# Patient Record
Sex: Male | Born: 1975
Health system: Southern US, Community
[De-identification: ages and names within clinical notes are randomized; demographics above are authoritative.]

## PROBLEM LIST (undated history)

## (undated) DIAGNOSIS — R569 Unspecified convulsions: Secondary | ICD-10-CM

## (undated) DIAGNOSIS — I10 Essential (primary) hypertension: Secondary | ICD-10-CM

## (undated) DIAGNOSIS — K219 Gastro-esophageal reflux disease without esophagitis: Secondary | ICD-10-CM

## (undated) HISTORY — DX: Gastro-esophageal reflux disease without esophagitis: K21.9

## (undated) HISTORY — PX: OTHER SURGICAL HISTORY: SHX169

## (undated) HISTORY — DX: Essential (primary) hypertension: I10

## (undated) HISTORY — DX: Unspecified convulsions: R56.9

---

## 2015-03-09 ENCOUNTER — Encounter: Payer: Self-pay | Admitting: Internal Medicine

## 2015-03-09 ENCOUNTER — Other Ambulatory Visit (INDEPENDENT_AMBULATORY_CARE_PROVIDER_SITE_OTHER): Payer: PRIVATE HEALTH INSURANCE

## 2015-03-09 ENCOUNTER — Ambulatory Visit (INDEPENDENT_AMBULATORY_CARE_PROVIDER_SITE_OTHER): Payer: PRIVATE HEALTH INSURANCE | Admitting: Internal Medicine

## 2015-03-09 VITALS — BP 118/72 | HR 68 | Temp 98.5°F | Resp 20 | Ht 75.0 in | Wt 295.2 lb

## 2015-03-09 DIAGNOSIS — I10 Essential (primary) hypertension: Secondary | ICD-10-CM

## 2015-03-09 DIAGNOSIS — R569 Unspecified convulsions: Secondary | ICD-10-CM | POA: Diagnosis not present

## 2015-03-09 DIAGNOSIS — K219 Gastro-esophageal reflux disease without esophagitis: Secondary | ICD-10-CM

## 2015-03-09 DIAGNOSIS — E669 Obesity, unspecified: Secondary | ICD-10-CM

## 2015-03-09 LAB — COMPREHENSIVE METABOLIC PANEL
ALK PHOS: 131 U/L — AB (ref 39–117)
ALT: 28 U/L (ref 0–53)
AST: 19 U/L (ref 0–37)
Albumin: 4.1 g/dL (ref 3.5–5.2)
BILIRUBIN TOTAL: 0.4 mg/dL (ref 0.2–1.2)
BUN: 18 mg/dL (ref 6–23)
CO2: 31 meq/L (ref 19–32)
Calcium: 9.5 mg/dL (ref 8.4–10.5)
Chloride: 101 mEq/L (ref 96–112)
Creatinine, Ser: 1.16 mg/dL (ref 0.40–1.50)
GFR: 90.33 mL/min (ref >60.00)
GLUCOSE: 95 mg/dL (ref 70–99)
Potassium: 4.5 mEq/L (ref 3.5–5.1)
SODIUM: 138 meq/L (ref 135–145)
TOTAL PROTEIN: 8.7 g/dL — AB (ref 6.0–8.3)

## 2015-03-09 LAB — CBC WITH DIFFERENTIAL/PLATELET
BASOS ABS: 0 10*3/uL (ref 0.0–0.1)
Basophils Relative: 0.3 % (ref 0.0–3.0)
EOS ABS: 0.4 10*3/uL (ref 0.0–0.7)
Eosinophils Relative: 5 % (ref 0.0–5.0)
HCT: 39.3 % (ref 39.0–52.0)
Hemoglobin: 13.4 g/dL (ref 13.0–17.0)
LYMPHS ABS: 2.3 10*3/uL (ref 0.7–4.0)
Lymphocytes Relative: 28 % (ref 12.0–46.0)
MCHC: 34.1 g/dL (ref 30.0–36.0)
MCV: 92.7 fl (ref 78.0–100.0)
MONO ABS: 0.6 10*3/uL (ref 0.1–1.0)
Monocytes Relative: 7.1 % (ref 3.0–12.0)
NEUTROS PCT: 59.6 % (ref 43.0–77.0)
Neutro Abs: 5 10*3/uL (ref 1.4–7.7)
PLATELETS: 431 10*3/uL — AB (ref 150.0–400.0)
RBC: 4.24 Mil/uL (ref 4.22–5.81)
RDW: 13.9 % (ref 11.5–15.5)
WBC: 8.3 10*3/uL (ref 4.0–10.5)

## 2015-03-09 LAB — LIPID PANEL
CHOLESTEROL: 164 mg/dL (ref 0–200)
HDL: 53.4 mg/dL (ref >39.00)
LDL CALC: 100 mg/dL — AB (ref 0–99)
NonHDL: 110.77
TRIGLYCERIDES: 53 mg/dL (ref 0.0–149.0)
Total CHOL/HDL Ratio: 3
VLDL: 10.6 mg/dL (ref 0.0–40.0)

## 2015-03-09 LAB — TSH: TSH: 1.46 u[IU]/mL (ref 0.35–4.50)

## 2015-03-09 LAB — HEMOGLOBIN A1C: Hgb A1c MFr Bld: 5.9 % (ref 4.6–6.5)

## 2015-03-09 NOTE — Addendum Note (Signed)
Addended by: Binnie Rail on: 03/09/2015 11:07 AM   Modules accepted: Miquel Dunn

## 2015-03-09 NOTE — Progress Notes (Addendum)
Subjective:    Patient ID: James Pitts, male    DOB: 06/09/1975, 39 y.o.   MRN: [id number]  HPI  He is here to establish with a new pcp.  He has no concerns - just needs a pcp.  Seizure disorder: He was diagnosed with seizures in college-his sophomore year. He is taking Keppra twice daily. He has not had any seizure since 2011. He knows 1 trigger is sleep deprivation and he is good about that situation. Even knows that some days he may need to come late if he is sleep deprived. He was following with a neurologist regularly, but was discharged because he has been so stable.  Hypertension: He is taking his medication daily. He is somewhat compliant with a low sodium diet.  He denies chest pain, palpitations, edema, shortness of breath and regular headaches. He is not exercising regularly.  He does not monitor his blood pressure at home.    GERD:  He is taking his medication daily as prescribed.  He denies any GERD symptoms and feels his GERD is well controlled.  He is compliant with a GERD diet.    He is currently not exercising regularly, but he knows he needs to start exercising. He would like to lose weight in the next few months before his birthday.  Medications and allergies reviewed with patient and updated if appropriate.  Patient Active Problem List   Diagnosis Date Noted  . Essential hypertension, benign 03/09/2015  . GERD (gastroesophageal reflux disease) 03/09/2015  . Seizures ([place]) 03/09/2015  . Obese 03/09/2015    No current outpatient prescriptions on file prior to visit.   No current facility-administered medications on file prior to visit.    Past Medical History  Diagnosis Date  . Essential hypertension   . Seizures ([place])   . GERD (gastroesophageal reflux disease)     Past Surgical History  Procedure Laterality Date  . Tonsilecomy      Social History   Social History  . Marital Status: Married    Spouse Name: N/A  . Number of Children: 4  . Years  of Education: N/A   Social History Main Topics  . Smoking status: Never Smoker   . Smokeless tobacco: None  . Alcohol Use: No  . Drug Use: No  . Sexual Activity: Not Asked   Other Topics Concern  . None   Social History Narrative   Occupational therapist    Review of Systems  Constitutional: Negative for fever and chills.  Eyes: Negative for visual disturbance.  Respiratory: Negative for cough, shortness of breath and wheezing.   Cardiovascular: Negative for chest pain, palpitations and leg swelling.  Gastrointestinal: Negative for nausea, abdominal pain and blood in stool.       GERD  Endocrine: Negative for polydipsia and polyuria.  Musculoskeletal: Negative for back pain and arthralgias.  Neurological: Positive for headaches (occ). Negative for dizziness, weakness, light-headedness and numbness.  Psychiatric/Behavioral: Negative for dysphoric mood. The patient is not nervous/anxious.        Objective:   Filed Vitals:   03/09/15 1018  BP: 118/72  Pulse: 68  Temp: 98.5 F (36.9 C)  Resp: 20   Filed Weights   03/09/15 1018  Weight: 295 lb 4 oz (133.925 kg)   Body mass index is 36.9 kg/(m^2).   Physical Exam  Constitutional: He appears well-developed and well-nourished. No distress.  HENT:  Head: Normocephalic and atraumatic.  Right Ear: External ear normal.  Left Ear: External ear  normal.  Mouth/Throat: Oropharynx is clear and moist.  B/l ear canals and TM normal  Eyes: Conjunctivae are normal.  Neck: Neck supple. No tracheal deviation present. No thyromegaly present.  Cardiovascular: Normal rate, regular rhythm and normal heart sounds.   No murmur heard. Pulmonary/Chest: Effort normal and breath sounds normal. No respiratory distress. He has no wheezes. He has no rales.  Abdominal: Soft. He exhibits no distension. There is no tenderness.  Musculoskeletal: He exhibits no edema.  Lymphadenopathy:    He has no cervical adenopathy.  Skin: Skin is warm and  dry.  Psychiatric: He has a normal mood and affect. His behavior is normal.          Assessment & Plan:   See problem list for assessment and plan  Screening blood work ordered  Follow-up in 6 months, sooner if needed

## 2015-03-09 NOTE — Patient Instructions (Addendum)
  We have reviewed your prior records including labs and tests today.  Test(s) ordered today. Your results will be released to MyChart (or called to you) after review, usually within 72hours after test completion. If any changes need to be made, you will be notified at that same time.  No immunizations administered today.   Medications reviewed and updated.  No changes recommended at this time.  Please schedule followup in 6 months      

## 2015-03-09 NOTE — Assessment & Plan Note (Signed)
Discussed regular exercise Will check screening blood work

## 2015-03-09 NOTE — Assessment & Plan Note (Signed)
Controlled, no seizure since 2000 Continue Keppra 500 mg twice daily  Not currently seeing a neurologist-was discharged because of not having a seizure in years

## 2015-03-09 NOTE — Assessment & Plan Note (Signed)
Well-controlled Continue current medication Recheck CMP, lipid panel

## 2015-03-09 NOTE — Assessment & Plan Note (Signed)
Well-controlled with medication-typically does not forget a dose Fairly compliant with her diet Plans on working on weight loss-stressed weight loss Discussed the ultimate goal is to get off of the medication

## 2015-03-09 NOTE — Progress Notes (Signed)
Pre visit review using our clinic review tool, if applicable. No additional management support is needed unless otherwise documented below in the visit note. 

## 2015-03-12 ENCOUNTER — Encounter: Payer: Self-pay | Admitting: Internal Medicine

## 2015-03-12 DIAGNOSIS — R7303 Prediabetes: Secondary | ICD-10-CM | POA: Insufficient documentation

## 2015-03-15 ENCOUNTER — Telehealth: Payer: Self-pay | Admitting: Internal Medicine

## 2015-03-15 NOTE — Telephone Encounter (Signed)
Spoke with pt to inform of lab results. Pt understood.

## 2015-03-15 NOTE — Telephone Encounter (Signed)
Pt returned your call. Please call back.

## 2015-06-14 ENCOUNTER — Encounter: Payer: Self-pay | Admitting: Nurse Practitioner

## 2015-06-14 ENCOUNTER — Ambulatory Visit (INDEPENDENT_AMBULATORY_CARE_PROVIDER_SITE_OTHER): Payer: PRIVATE HEALTH INSURANCE | Admitting: Nurse Practitioner

## 2015-06-14 VITALS — BP 142/98 | HR 72 | Wt 288.0 lb

## 2015-06-14 DIAGNOSIS — I10 Essential (primary) hypertension: Secondary | ICD-10-CM

## 2015-06-14 MED ORDER — HYDROCHLOROTHIAZIDE 12.5 MG PO TABS: 12.5000 mg | ORAL_TABLET | Freq: Every day | ORAL | Status: DC

## 2015-06-14 NOTE — Patient Instructions (Signed)
Bring BP to your next appointment. Call us if you feel dizzy or have any problems with the new medication.

## 2015-06-14 NOTE — Progress Notes (Signed)
Pre visit review using our clinic review tool, if applicable. No additional management support is needed unless otherwise documented below in the visit note. 

## 2015-06-14 NOTE — Assessment & Plan Note (Signed)
BP Readings from Last 3 Encounters:  06/14/15 142/98  03/09/15 118/72   HCTZ added to his Losartan regimen.  Asked him to take daily in the am for possibility of increased urination FU in 2-3 weeks with myself or his PCP. Will check a BMET at that visit.

## 2015-06-14 NOTE — Progress Notes (Signed)
Patient ID: James Pitts, male    DOB: 05-Nov-1975  Age: 40 y.o. MRN: [id number]  CC: Hypertension   HPI James Pitts presents for CC of HTN.   1) Pt reports okay today  Feels sluggish and has a slight HA when BP is elevated  Checks at home for past week and a half  150's/110's am and pm   Amlodipine- feet swelling and dizziness  Lisinopril- dizziness    Wife is a Marine scientist and is concerned   History James Pitts has a past medical history of Essential hypertension; Seizures ([place]); and GERD (gastroesophageal reflux disease).   He has past surgical history that includes tonsilecomy.   His family history includes Asthma in his father; Hypertension in his father and mother; Multiple sclerosis in his mother.He reports that he has never smoked. He does not have any smokeless tobacco history on file. He reports that he does not drink alcohol or use illicit drugs.  Outpatient Prescriptions Prior to Visit  Medication Sig Dispense Refill  . esomeprazole (NEXIUM) 20 MG capsule Take 20 mg by mouth daily at 12 noon.    . levETIRAcetam (KEPPRA) 500 MG tablet Take 500 mg by mouth 2 (two) times daily.    Marland Kitchen losartan (COZAAR) 100 MG tablet Take 100 mg by mouth daily.     No facility-administered medications prior to visit.    ROS Review of Systems  Constitutional: Negative for fever, chills, diaphoresis and fatigue.  Eyes: Negative for visual disturbance.  Respiratory: Negative for chest tightness, shortness of breath and wheezing.   Cardiovascular: Negative for chest pain, palpitations and leg swelling.  Neurological: Positive for headaches. Negative for dizziness, weakness and numbness.    Objective:  BP 142/98 mmHg  Pulse 72  Wt 288 lb (130.636 kg)  SpO2 99%  Physical Exam  Constitutional: He is oriented to person, place, and time. He appears well-developed and well-nourished. No distress.  HENT:  Head: Normocephalic and atraumatic.  Right Ear: External ear normal.  Left Ear: External  ear normal.  Cardiovascular: Normal rate, regular rhythm and normal heart sounds.  Exam reveals no gallop and no friction rub.   No murmur heard. Pulmonary/Chest: Effort normal and breath sounds normal. No respiratory distress. He has no wheezes. He has no rales. He exhibits no tenderness.  Neurological: He is alert and oriented to person, place, and time.  Skin: Skin is warm and dry. No rash noted. He is not diaphoretic.  Psychiatric: He has a normal mood and affect. His behavior is normal. Judgment and thought content normal.   Assessment & Plan:   James Pitts was seen today for hypertension.  Diagnoses and all orders for this visit:  Essential hypertension, benign  Other orders -     hydrochlorothiazide (HYDRODIURIL) 12.5 MG tablet; Take 1 tablet (12.5 mg total) by mouth daily.  I am having James Pitts start on hydrochlorothiazide. I am also having him maintain his levETIRAcetam, losartan, and esomeprazole.  Meds ordered this encounter  Medications  . hydrochlorothiazide (HYDRODIURIL) 12.5 MG tablet    Sig: Take 1 tablet (12.5 mg total) by mouth daily.    Dispense:  30 tablet    Refill:  0    Order Specific Question:  Supervising Provider    Answer:  Crecencio Mc [[id number]]     Follow-up: Return in about 3 weeks (around 07/05/2015) for Follow up on medication .

## 2015-06-26 ENCOUNTER — Telehealth: Payer: Self-pay | Admitting: Internal Medicine

## 2015-06-26 ENCOUNTER — Telehealth: Payer: Self-pay

## 2015-06-26 MED ORDER — ESOMEPRAZOLE MAGNESIUM 20 MG PO CPDR: 20.0000 mg | DELAYED_RELEASE_CAPSULE | Freq: Every day | ORAL | Status: DC

## 2015-06-26 NOTE — Telephone Encounter (Signed)
DMV medical forms received, completed and placed on MD's desk for signature

## 2015-06-26 NOTE — Telephone Encounter (Signed)
RX has been sent to [facility] 

## 2015-06-26 NOTE — Telephone Encounter (Signed)
Paperwork completed, faxed, copy sent to scan

## 2015-06-26 NOTE — Telephone Encounter (Signed)
Ok to fill 

## 2015-06-26 NOTE — Telephone Encounter (Signed)
Pt came by office requesting a refill on Nexium.  Please send the refill to [facility] [place] of [address] per pt.

## 2015-07-18 ENCOUNTER — Other Ambulatory Visit (INDEPENDENT_AMBULATORY_CARE_PROVIDER_SITE_OTHER): Payer: PRIVATE HEALTH INSURANCE

## 2015-07-18 ENCOUNTER — Ambulatory Visit (INDEPENDENT_AMBULATORY_CARE_PROVIDER_SITE_OTHER): Payer: PRIVATE HEALTH INSURANCE | Admitting: Internal Medicine

## 2015-07-18 ENCOUNTER — Encounter: Payer: Self-pay | Admitting: Internal Medicine

## 2015-07-18 ENCOUNTER — Other Ambulatory Visit: Payer: Self-pay | Admitting: Nurse Practitioner

## 2015-07-18 VITALS — BP 136/94 | HR 66 | Temp 98.3°F | Resp 16 | Wt 284.0 lb

## 2015-07-18 DIAGNOSIS — R7303 Prediabetes: Secondary | ICD-10-CM

## 2015-07-18 DIAGNOSIS — I10 Essential (primary) hypertension: Secondary | ICD-10-CM | POA: Diagnosis not present

## 2015-07-18 DIAGNOSIS — K219 Gastro-esophageal reflux disease without esophagitis: Secondary | ICD-10-CM

## 2015-07-18 DIAGNOSIS — R569 Unspecified convulsions: Secondary | ICD-10-CM

## 2015-07-18 LAB — COMPREHENSIVE METABOLIC PANEL
ALBUMIN: 4.2 g/dL (ref 3.5–5.2)
ALT: 25 U/L (ref 0–53)
AST: 19 U/L (ref 0–37)
Alkaline Phosphatase: 121 U/L — ABNORMAL HIGH (ref 39–117)
BUN: 14 mg/dL (ref 6–23)
CO2: 31 mEq/L (ref 19–32)
Calcium: 9.5 mg/dL (ref 8.4–10.5)
Chloride: 100 mEq/L (ref 96–112)
Creatinine, Ser: 1.2 mg/dL (ref 0.40–1.50)
GFR: 86.71 mL/min (ref >60.00)
Glucose, Bld: 80 mg/dL (ref 70–99)
POTASSIUM: 4.3 meq/L (ref 3.5–5.1)
Sodium: 137 mEq/L (ref 135–145)
TOTAL PROTEIN: 8.7 g/dL — AB (ref 6.0–8.3)
Total Bilirubin: 0.4 mg/dL (ref 0.2–1.2)

## 2015-07-18 LAB — HEMOGLOBIN A1C: HEMOGLOBIN A1C: 5.9 % (ref 4.6–6.5)

## 2015-07-18 NOTE — Telephone Encounter (Signed)
Please advise refill, as our provider saw the patient for an acute issue. Thanks

## 2015-07-18 NOTE — Assessment & Plan Note (Signed)
Controlled Last seizure 2011 Stable on keppra - continue current dose

## 2015-07-18 NOTE — Assessment & Plan Note (Signed)
Recheck a1c Working on weight loss Exercising Continue to work on diet changes

## 2015-07-18 NOTE — Assessment & Plan Note (Signed)
Controlled Working on weight loss Continue nexium daily

## 2015-07-18 NOTE — Progress Notes (Signed)
Pre visit review using our clinic review tool, if applicable. No additional management support is needed unless otherwise documented below in the visit note. 

## 2015-07-18 NOTE — Progress Notes (Signed)
Subjective:    Patient ID: James Pitts, male    DOB: May 21, 1975, 40 y.o.   MRN: [id number]  HPI He is here for follow up.  Hypertension: He did not tolerate amlodipine or lisinopril in the past.   He is taking his medication daily. - just the amlodipine as he did not start the losartan He is compliant with a low sodium diet.  He denies chest pain, palpitations, edema, shortness of breath and regular headaches. He is exercising regularly.  He has made diet changes and is working on weight loss. He does monitor his blood pressure at home, 130/80.    Prediabetes:  He is exercising.  He is working on weight loss.  He is eating better - still working on lifestyle.    GERD:  He is taking his medication daily as prescribed.  He denies any GERD symptoms and feels his GERD is well controlled.   Seizure d/o:  Taking his medication daily.  Last seizure was 2011. He denies any seizure activity.  He denies side effects from the medication.   Medications and allergies reviewed with patient and updated if appropriate.  Patient Active Problem List   Diagnosis Date Noted  . Prediabetes 03/12/2015  . Essential hypertension, benign 03/09/2015  . GERD (gastroesophageal reflux disease) 03/09/2015  . Seizures ([place]) 03/09/2015  . Obese 03/09/2015    Current Outpatient Prescriptions on File Prior to Visit  Medication Sig Dispense Refill  . esomeprazole (NEXIUM) 20 MG capsule Take 1 capsule (20 mg total) by mouth daily at 12 noon. 90 capsule 2  . hydrochlorothiazide (HYDRODIURIL) 12.5 MG tablet Take 1 tablet (12.5 mg total) by mouth daily. 30 tablet 0  . levETIRAcetam (KEPPRA) 500 MG tablet Take 500 mg by mouth 2 (two) times daily.    Marland Kitchen losartan (COZAAR) 100 MG tablet Take 100 mg by mouth daily.     No current facility-administered medications on file prior to visit.    Past Medical History  Diagnosis Date  . Essential hypertension   . Seizures ([place])   . GERD (gastroesophageal reflux disease)       Past Surgical History  Procedure Laterality Date  . Tonsilecomy      Social History   Social History  . Marital Status: Married    Spouse Name: N/A  . Number of Children: 4  . Years of Education: N/A   Social History Main Topics  . Smoking status: Never Smoker   . Smokeless tobacco: Not on file  . Alcohol Use: No  . Drug Use: No  . Sexual Activity: Not on file   Other Topics Concern  . Not on file   Social History Narrative   Occupational therapist    Family History  Problem Relation Age of Onset  . Hypertension Mother   . Multiple sclerosis Mother   . Hypertension Father   . Asthma Father     Review of Systems  Constitutional: Negative for fever.  Respiratory: Negative for shortness of breath and wheezing.   Cardiovascular: Negative for chest pain, palpitations and leg swelling.  Neurological: Positive for headaches (fatigue). Negative for dizziness, seizures and light-headedness.       Objective:   Filed Vitals:   07/18/15 1348  BP: 136/94  Pulse: 66  Temp: 98.3 F (36.8 C)  Resp: 16   Filed Weights   07/18/15 1348  Weight: 284 lb (128.822 kg)   Body mass index is 35.5 kg/(m^2).   Physical Exam Constitutional: Appears well-developed and  well-nourished. No distress.  Neck: Neck supple. No tracheal deviation present. No thyromegaly present.  No carotid bruit. No cervical adenopathy.   Cardiovascular: Normal rate, regular rhythm and normal heart sounds.   No murmur heard.  No edema Pulmonary/Chest: Effort normal and breath sounds normal. No respiratory distress. No wheezes.  Abd: soft, non-tender       Assessment & Plan:   See Problem List for Assessment and Plan of chronic medical problems.  F/u in 6 months

## 2015-07-18 NOTE — Assessment & Plan Note (Signed)
Slightly high, but has been well controlled at home Continue to work on weight loss and diet changes Continue regular exercise Continue amlodipine Check cmp

## 2015-07-18 NOTE — Patient Instructions (Addendum)

## 2015-09-07 ENCOUNTER — Ambulatory Visit: Payer: PRIVATE HEALTH INSURANCE | Admitting: Internal Medicine

## 2015-09-12 ENCOUNTER — Telehealth: Payer: Self-pay | Admitting: *Deleted

## 2015-09-12 MED ORDER — ESOMEPRAZOLE MAGNESIUM 20 MG PO CPDR: 20.0000 mg | DELAYED_RELEASE_CAPSULE | Freq: Every day | ORAL | Status: DC

## 2015-09-12 MED ORDER — HYDROCHLOROTHIAZIDE 12.5 MG PO TABS: ORAL_TABLET | ORAL | Status: DC

## 2015-09-12 NOTE — Telephone Encounter (Signed)
Receive call pt states he is having to use his mail service for his medications [facility] Sent electronically...James Pitts

## 2015-09-14 ENCOUNTER — Other Ambulatory Visit: Payer: Self-pay | Admitting: *Deleted

## 2015-09-14 MED ORDER — LEVETIRACETAM 500 MG PO TABS: 500.0000 mg | ORAL_TABLET | Freq: Two times a day (BID) | ORAL | Status: DC

## 2015-09-14 MED ORDER — HYDROCHLOROTHIAZIDE 12.5 MG PO TABS: ORAL_TABLET | ORAL | Status: DC

## 2015-09-14 MED ORDER — ESOMEPRAZOLE MAGNESIUM 20 MG PO CPDR: 20.0000 mg | DELAYED_RELEASE_CAPSULE | Freq: Every day | ORAL | Status: DC

## 2015-09-14 NOTE — Telephone Encounter (Signed)
Wife left msg needing refills sent to mail service on all meds. Called pt wife no answer LMOM [facility]...Johny Chess

## 2016-01-23 ENCOUNTER — Ambulatory Visit (INDEPENDENT_AMBULATORY_CARE_PROVIDER_SITE_OTHER): Payer: PRIVATE HEALTH INSURANCE | Admitting: Internal Medicine

## 2016-01-23 ENCOUNTER — Encounter: Payer: Self-pay | Admitting: Internal Medicine

## 2016-01-23 ENCOUNTER — Other Ambulatory Visit (INDEPENDENT_AMBULATORY_CARE_PROVIDER_SITE_OTHER): Payer: PRIVATE HEALTH INSURANCE

## 2016-01-23 VITALS — BP 154/110 | HR 79 | Temp 98.5°F | Resp 18 | Ht 75.0 in | Wt 291.0 lb

## 2016-01-23 DIAGNOSIS — Z23 Encounter for immunization: Secondary | ICD-10-CM

## 2016-01-23 DIAGNOSIS — K219 Gastro-esophageal reflux disease without esophagitis: Secondary | ICD-10-CM

## 2016-01-23 DIAGNOSIS — R7303 Prediabetes: Secondary | ICD-10-CM

## 2016-01-23 DIAGNOSIS — R569 Unspecified convulsions: Secondary | ICD-10-CM | POA: Diagnosis not present

## 2016-01-23 DIAGNOSIS — I10 Essential (primary) hypertension: Secondary | ICD-10-CM | POA: Diagnosis not present

## 2016-01-23 DIAGNOSIS — L309 Dermatitis, unspecified: Secondary | ICD-10-CM

## 2016-01-23 LAB — CBC WITH DIFFERENTIAL/PLATELET
BASOS PCT: 0.4 % (ref 0.0–3.0)
Basophils Absolute: 0 10*3/uL (ref 0.0–0.1)
EOS PCT: 6.5 % — AB (ref 0.0–5.0)
Eosinophils Absolute: 0.5 10*3/uL (ref 0.0–0.7)
HCT: 36 % — ABNORMAL LOW (ref 39.0–52.0)
HEMOGLOBIN: 12.7 g/dL — AB (ref 13.0–17.0)
LYMPHS ABS: 1.7 10*3/uL (ref 0.7–4.0)
Lymphocytes Relative: 21.4 % (ref 12.0–46.0)
MCHC: 35.3 g/dL (ref 30.0–36.0)
MCV: 92 fl (ref 78.0–100.0)
MONO ABS: 0.8 10*3/uL (ref 0.1–1.0)
Monocytes Relative: 10 % (ref 3.0–12.0)
NEUTROS ABS: 4.9 10*3/uL (ref 1.4–7.7)
NEUTROS PCT: 61.7 % (ref 43.0–77.0)
Platelets: 436 10*3/uL — ABNORMAL HIGH (ref 150.0–400.0)
RBC: 3.91 Mil/uL — ABNORMAL LOW (ref 4.22–5.81)
RDW: 12.9 % (ref 11.5–15.5)
WBC: 8 10*3/uL (ref 4.0–10.5)

## 2016-01-23 LAB — LIPID PANEL
CHOLESTEROL: 171 mg/dL (ref 0–200)
HDL: 51.4 mg/dL (ref >39.00)
LDL Cholesterol: 108 mg/dL — ABNORMAL HIGH (ref 0–99)
NonHDL: 120
TRIGLYCERIDES: 58 mg/dL (ref 0.0–149.0)
Total CHOL/HDL Ratio: 3
VLDL: 11.6 mg/dL (ref 0.0–40.0)

## 2016-01-23 LAB — COMPREHENSIVE METABOLIC PANEL
ALBUMIN: 4.1 g/dL (ref 3.5–5.2)
ALK PHOS: 105 U/L (ref 39–117)
ALT: 29 U/L (ref 0–53)
AST: 33 U/L (ref 0–37)
BUN: 19 mg/dL (ref 6–23)
CO2: 30 mEq/L (ref 19–32)
Calcium: 9 mg/dL (ref 8.4–10.5)
Chloride: 103 mEq/L (ref 96–112)
Creatinine, Ser: 1.31 mg/dL (ref 0.40–1.50)
GFR: 78.15 mL/min (ref >60.00)
Glucose, Bld: 92 mg/dL (ref 70–99)
POTASSIUM: 4.3 meq/L (ref 3.5–5.1)
Sodium: 140 mEq/L (ref 135–145)
TOTAL PROTEIN: 8.6 g/dL — AB (ref 6.0–8.3)
Total Bilirubin: 0.3 mg/dL (ref 0.2–1.2)

## 2016-01-23 LAB — HEMOGLOBIN A1C: HEMOGLOBIN A1C: 5.9 % (ref 4.6–6.5)

## 2016-01-23 MED ORDER — CLOBETASOL PROPIONATE 0.05 % EX CREA: 1.0000 "application " | TOPICAL_CREAM | Freq: Two times a day (BID) | CUTANEOUS | 3 refills | Status: DC

## 2016-01-23 NOTE — Patient Instructions (Addendum)
  Test(s) ordered today. Your results will be released to [place] (or called to you) after review, usually within 72hours after test completion. If any changes need to be made, you will be notified at that same time.  All other Health Maintenance issues reviewed.   All recommended immunizations and age-appropriate screenings are up-to-date or discussed.  Flu and tetanus vaccines administered today.   Medications reviewed and updated.  Changes include a steroid cream for the eczema  Your prescription(s) have been submitted to your pharmacy. Please take as directed and contact our office if you believe you are having problem(s) with the medication(s).   Please followup in 6 months

## 2016-01-23 NOTE — Assessment & Plan Note (Addendum)
In antecubital spaces, neck and shoulder areas His daughter also has it Trial of clobetasol cream If no improvement can consider dermatology referral

## 2016-01-23 NOTE — Assessment & Plan Note (Signed)
No seizures since 2011 continue keppra at current dose

## 2016-01-23 NOTE — Progress Notes (Signed)
Pre visit review using our clinic review tool, if applicable. No additional management support is needed unless otherwise documented below in the visit note. 

## 2016-01-23 NOTE — Progress Notes (Signed)
Subjective:    Patient ID: James Pitts, male    DOB: 09-21-1975, 40 y.o.   MRN: [id number]  HPI The patient is here for follow up.  Hypertension: He is taking his medication daily. He is compliant with a low sodium diet.  He denies chest pain, palpitations, edema, shortness of breath and regular headaches. He is not exercising regularly.  He does monitor his blood pressure at home, 126-130/80-90.    GERD:  He is taking his medication daily as prescribed.  He denies any GERD symptoms and feels his GERD is well controlled.   Seizure disorder:  He is taking his medication daily as prescribed.He denies side effects. He has been seizure free since his last visit. His last seizure was 2011.  Prediabetes:  He is compliant with a low sugar/carbohydrate diet.  He is not exercising regularly.  Eczema: He believes he has eczema. His daughter has it. He has an itchy rash on his arms, shoulders and neck. He has not tried putting anything on it.   Medications and allergies reviewed with patient and updated if appropriate.  Patient Active Problem List   Diagnosis Date Noted  . Prediabetes 03/12/2015  . Essential hypertension, benign 03/09/2015  . GERD (gastroesophageal reflux disease) 03/09/2015  . Seizures ([place]) 03/09/2015  . Obese 03/09/2015    Current Outpatient Prescriptions on File Prior to Visit  Medication Sig Dispense Refill  . esomeprazole (NEXIUM) 20 MG capsule Take 1 capsule (20 mg total) by mouth daily at 12 noon. 90 capsule 1  . hydrochlorothiazide (HYDRODIURIL) 12.5 MG tablet TAKE 1 TABLET (12.5 MG TOTAL) BY MOUTH DAILY. 90 tablet 1  . levETIRAcetam (KEPPRA) 500 MG tablet Take 1 tablet (500 mg total) by mouth 2 (two) times daily. 180 tablet 1   No current facility-administered medications on file prior to visit.     Past Medical History:  Diagnosis Date  . Essential hypertension   . GERD (gastroesophageal reflux disease)   . Seizures ([place])     Past Surgical History:    Procedure Laterality Date  . tonsilecomy      Social History   Social History  . Marital status: Married    Spouse name: N/A  . Number of children: 4  . Years of education: N/A   Social History Main Topics  . Smoking status: Never Smoker  . Smokeless tobacco: Not on file  . Alcohol use No  . Drug use: No  . Sexual activity: Not on file   Other Topics Concern  . Not on file   Social History Narrative   Occupational therapist    Family History  Problem Relation Age of Onset  . Hypertension Mother   . Multiple sclerosis Mother   . Hypertension Father   . Asthma Father     Review of Systems  Constitutional: Negative for fever.  Eyes: Negative for visual disturbance.  Respiratory: Negative for cough, shortness of breath and wheezing.   Cardiovascular: Negative for chest pain, palpitations and leg swelling.  Gastrointestinal: Negative for abdominal pain.  Endocrine: Negative for polydipsia and polyuria.  Neurological: Positive for headaches (weather change). Negative for dizziness and light-headedness.       Objective:   Vitals:   01/23/16 0821  BP: (!) 154/110  Pulse: 79  Resp: 18  Temp: 98.5 F (36.9 C)   Filed Weights   01/23/16 0821  Weight: 291 lb (132 kg)   Body mass index is 36.37 kg/m.   Physical Exam  Constitutional: Appears well-developed and well-nourished. No distress.  HENT:  Head: Normocephalic and atraumatic.  Neck: Neck supple. No tracheal deviation present. No thyromegaly present.  No cervical lymphadenopathy Cardiovascular: Normal rate, regular rhythm and normal heart sounds.   No murmur heard. No carotid bruit .  No edema Pulmonary/Chest: Effort normal and breath sounds normal. No respiratory distress. No has no wheezes. No rales.  Skin: Skin is warm and dry. Not diaphoretic. eczema like patches antecubital lesions Psychiatric: Normal mood and affect. Behavior is normal.      Assessment & Plan:   Flu and tetanus vaccines  today  See Problem List for Assessment and Plan of chronic medical problems.   Follow-up in 6 months, sooner if needed

## 2016-01-23 NOTE — Assessment & Plan Note (Signed)
GERD controlled Continue daily medication  

## 2016-01-23 NOTE — Assessment & Plan Note (Addendum)
Well controlled at home Reviewed blood pressure goal of less than 140/90 Stressed exercise, weight loss Continue current medication at current doses cmp

## 2016-01-23 NOTE — Assessment & Plan Note (Signed)
Check a1c Stressed exercise and low sugar/carb diet

## 2016-03-13 ENCOUNTER — Other Ambulatory Visit: Payer: Self-pay | Admitting: Internal Medicine

## 2016-05-21 ENCOUNTER — Other Ambulatory Visit: Payer: Self-pay | Admitting: Internal Medicine

## 2016-06-11 ENCOUNTER — Other Ambulatory Visit: Payer: Self-pay | Admitting: Internal Medicine

## 2016-07-23 ENCOUNTER — Other Ambulatory Visit (INDEPENDENT_AMBULATORY_CARE_PROVIDER_SITE_OTHER): Payer: PRIVATE HEALTH INSURANCE

## 2016-07-23 ENCOUNTER — Ambulatory Visit (INDEPENDENT_AMBULATORY_CARE_PROVIDER_SITE_OTHER): Payer: PRIVATE HEALTH INSURANCE | Admitting: Internal Medicine

## 2016-07-23 ENCOUNTER — Encounter: Payer: Self-pay | Admitting: Internal Medicine

## 2016-07-23 VITALS — BP 124/84 | HR 86 | Temp 97.8°F | Resp 16 | Ht 75.0 in | Wt 292.0 lb

## 2016-07-23 DIAGNOSIS — R569 Unspecified convulsions: Secondary | ICD-10-CM | POA: Diagnosis not present

## 2016-07-23 DIAGNOSIS — I10 Essential (primary) hypertension: Secondary | ICD-10-CM | POA: Diagnosis not present

## 2016-07-23 DIAGNOSIS — R7303 Prediabetes: Secondary | ICD-10-CM

## 2016-07-23 DIAGNOSIS — L309 Dermatitis, unspecified: Secondary | ICD-10-CM

## 2016-07-23 DIAGNOSIS — Z6836 Body mass index (BMI) 36.0-36.9, adult: Secondary | ICD-10-CM

## 2016-07-23 DIAGNOSIS — K219 Gastro-esophageal reflux disease without esophagitis: Secondary | ICD-10-CM | POA: Diagnosis not present

## 2016-07-23 DIAGNOSIS — Z0000 Encounter for general adult medical examination without abnormal findings: Secondary | ICD-10-CM | POA: Diagnosis not present

## 2016-07-23 LAB — COMPREHENSIVE METABOLIC PANEL
ALT: 27 U/L (ref 0–53)
AST: 21 U/L (ref 0–37)
Albumin: 4 g/dL (ref 3.5–5.2)
Alkaline Phosphatase: 112 U/L (ref 39–117)
BUN: 22 mg/dL (ref 6–23)
CO2: 29 mEq/L (ref 19–32)
Calcium: 9.2 mg/dL (ref 8.4–10.5)
Chloride: 105 mEq/L (ref 96–112)
Creatinine, Ser: 1.26 mg/dL (ref 0.40–1.50)
GFR: 81.53 mL/min (ref >60.00)
Glucose, Bld: 95 mg/dL (ref 70–99)
Potassium: 4.5 mEq/L (ref 3.5–5.1)
Sodium: 139 mEq/L (ref 135–145)
Total Bilirubin: 0.3 mg/dL (ref 0.2–1.2)
Total Protein: 8.3 g/dL (ref 6.0–8.3)

## 2016-07-23 LAB — CBC WITH DIFFERENTIAL/PLATELET
Basophils Absolute: 0.1 10*3/uL (ref 0.0–0.1)
Basophils Relative: 1.2 % (ref 0.0–3.0)
EOS PCT: 6.2 % — AB (ref 0.0–5.0)
Eosinophils Absolute: 0.5 10*3/uL (ref 0.0–0.7)
HCT: 36.9 % — ABNORMAL LOW (ref 39.0–52.0)
HEMOGLOBIN: 12.8 g/dL — AB (ref 13.0–17.0)
Lymphocytes Relative: 24.9 % (ref 12.0–46.0)
Lymphs Abs: 1.9 10*3/uL (ref 0.7–4.0)
MCHC: 34.7 g/dL (ref 30.0–36.0)
MCV: 93.5 fl (ref 78.0–100.0)
MONOS PCT: 10.1 % (ref 3.0–12.0)
Monocytes Absolute: 0.8 10*3/uL (ref 0.1–1.0)
Neutro Abs: 4.3 10*3/uL (ref 1.4–7.7)
Neutrophils Relative %: 57.6 % (ref 43.0–77.0)
Platelets: 379 10*3/uL (ref 150.0–400.0)
RBC: 3.94 Mil/uL — AB (ref 4.22–5.81)
RDW: 13.2 % (ref 11.5–15.5)
WBC: 7.4 10*3/uL (ref 4.0–10.5)

## 2016-07-23 LAB — LIPID PANEL
Cholesterol: 164 mg/dL (ref 0–200)
HDL: 47.5 mg/dL (ref >39.00)
LDL Cholesterol: 96 mg/dL (ref 0–99)
NonHDL: 116.31
Total CHOL/HDL Ratio: 3
Triglycerides: 100 mg/dL (ref 0.0–149.0)
VLDL: 20 mg/dL (ref 0.0–40.0)

## 2016-07-23 LAB — HEMOGLOBIN A1C: HEMOGLOBIN A1C: 6 % (ref 4.6–6.5)

## 2016-07-23 LAB — TSH: TSH: 1.64 u[IU]/mL (ref 0.35–4.50)

## 2016-07-23 NOTE — Assessment & Plan Note (Signed)
BP well controlled Current regimen effective and well tolerated Continue current medications at current doses  

## 2016-07-23 NOTE — Assessment & Plan Note (Addendum)
No seizure like activity Continue keppra at current dose Check keppra level

## 2016-07-23 NOTE — Assessment & Plan Note (Addendum)
GERD controlled Continue daily medication Encouraged weight loss

## 2016-07-23 NOTE — Progress Notes (Signed)
Subjective:    Patient ID: James Pitts, male    DOB: 10-17-1975, 41 y.o.   MRN: [id number]  HPI He is here for a physical exam.   He denies any changes in his health and has not concerns.   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  He knows he should do more.   He feels he is compliant with a low sugar/carb diet.    Medications and allergies reviewed with patient and updated if appropriate.  Patient Active Problem List   Diagnosis Date Noted  . Eczema 01/23/2016  . Prediabetes 03/12/2015  . Essential hypertension, benign 03/09/2015  . GERD (gastroesophageal reflux disease) 03/09/2015  . Seizures ([place]) 03/09/2015  . Obese 03/09/2015    Current Outpatient Prescriptions on File Prior to Visit  Medication Sig Dispense Refill  . clobetasol cream (TEMOVATE) 8.91 % Apply 1 application topically 2 (two) times daily. 30 g 3  . esomeprazole (NEXIUM) 20 MG capsule TAKE 1 CAPSULE DAILY AT 12 NOON 90 capsule 2  . hydrochlorothiazide (HYDRODIURIL) 12.5 MG tablet TAKE 1 TABLET DAILY 90 tablet 1  . levETIRAcetam (KEPPRA) 500 MG tablet TAKE 1 TABLET TWICE A DAY 180 tablet 1   No current facility-administered medications on file prior to visit.     Past Medical History:  Diagnosis Date  . Essential hypertension   . GERD (gastroesophageal reflux disease)   . Seizures ([place])     Past Surgical History:  Procedure Laterality Date  . tonsilecomy      Social History   Social History  . Marital status: Married    Spouse name: N/A  . Number of children: 4  . Years of education: N/A   Social History Main Topics  . Smoking status: Never Smoker  . Smokeless tobacco: Not on file  . Alcohol use No  . Drug use: No  . Sexual activity: Not on file   Other Topics Concern  . Not on file   Social History Narrative   Occupational therapist    Family History  Problem Relation Age of Onset  . Hypertension Mother   . Multiple sclerosis Mother   . Hypertension Father   .  Asthma Father     Review of Systems  Constitutional: Negative for chills and fever.  Eyes: Negative for visual disturbance.  Respiratory: Negative for cough, shortness of breath and wheezing.   Cardiovascular: Negative for chest pain, palpitations and leg swelling.  Gastrointestinal: Negative for abdominal pain, blood in stool, constipation, diarrhea and nausea.  Endocrine: Negative for polydipsia and polyuria.  Genitourinary: Negative for difficulty urinating, dysuria and hematuria.  Musculoskeletal: Negative for back pain.  Skin: Negative for color change and rash.  Neurological: Negative for dizziness, light-headedness and headaches.  Psychiatric/Behavioral: Negative for dysphoric mood. The patient is not nervous/anxious.        Objective:   Vitals:   07/23/16 0805  BP: 124/84  Pulse: 86  Resp: 16  Temp: 97.8 F (36.6 C)   Filed Weights   07/23/16 0805  Weight: 292 lb (132.5 kg)   Body mass index is 36.5 kg/m.  Wt Readings from Last 3 Encounters:  07/23/16 292 lb (132.5 kg)  01/23/16 291 lb (132 kg)  07/18/15 284 lb (128.8 kg)     Physical Exam Constitutional: He appears well-developed and well-nourished. No distress.  HENT:  Head: Normocephalic and atraumatic.  Right Ear: External ear normal.  Left Ear: External ear normal.  Mouth/Throat: Oropharynx is clear and moist.  Normal ear canals and TM b/l  Eyes: Conjunctivae and EOM are normal.  Neck: Neck supple. No tracheal deviation present. No thyromegaly present.  No carotid bruit  Cardiovascular: Normal rate, regular rhythm, normal heart sounds and intact distal pulses.   No murmur heard. Pulmonary/Chest: Effort normal and breath sounds normal. No respiratory distress. He has no wheezes. He has no rales.  Abdominal: Soft. Bowel sounds are normal. He exhibits no distension. There is no tenderness.  Genitourinary: deferred  Musculoskeletal: He exhibits no edema.  Lymphadenopathy:   He has no cervical  adenopathy.  Skin: Skin is warm and dry. He is not diaphoretic.  Psychiatric: He has a normal mood and affect. His behavior is normal.         Assessment & Plan:   Physical exam: Screening blood work  ordered Immunizations   Up to date  Eye exams  Up to date  Exercise  Doing some, but knows he needs to increase his exercise Weight - advised weight loss - stressed the importance of this for prevention of diabetes Skin no concerns Substance abuse  none  See Problem List for Assessment and Plan of chronic medical problems.  FU in 6 months

## 2016-07-23 NOTE — Progress Notes (Signed)
Pre visit review using our clinic review tool, if applicable. No additional management support is needed unless otherwise documented below in the visit note. 

## 2016-07-23 NOTE — Assessment & Plan Note (Signed)
Controlled with as needed clobetasol cream

## 2016-07-23 NOTE — Assessment & Plan Note (Addendum)
Check a1c Low sugar / carb diet Stressed regular exercise, weight loss to help prevent diabetes

## 2016-07-23 NOTE — Assessment & Plan Note (Signed)
Decreased portions, healthy diet, regular exercise

## 2016-07-23 NOTE — Patient Instructions (Signed)
Test(s) ordered today. Your results will be released to [place] (or called to you) after review, usually within 72hours after test completion. If any changes need to be made, you will be notified at that same time.  All other Health Maintenance issues reviewed.   All recommended immunizations and age-appropriate screenings are up-to-date or discussed.  No immunizations administered today.   Medications reviewed and updated.  No changes recommended at this time.   Please followup in 6 months    Health Maintenance, Male A healthy lifestyle and preventive care is important for your health and wellness. Ask your health care provider about what schedule of regular examinations is right for you. What should I know about weight and diet?  Eat a Healthy Diet  Eat plenty of vegetables, fruits, whole grains, low-fat dairy products, and lean protein.  Do not eat a lot of foods high in solid fats, added sugars, or salt. Maintain a Healthy Weight  Regular exercise can help you achieve or maintain a healthy weight. You should:  Do at least 150 minutes of exercise each week. The exercise should increase your heart rate and make you sweat (moderate-intensity exercise).  Do strength-training exercises at least twice a week. Watch Your Levels of Cholesterol and Blood Lipids  Have your blood tested for lipids and cholesterol every 5 years starting at 40 years of age. If you are at high risk for heart disease, you should start having your blood tested when you are 40 years old. You may need to have your cholesterol levels checked more often if:  Your lipid or cholesterol levels are high.  You are older than 40 years of age.  You are at high risk for heart disease. What should I know about cancer screening? Many types of cancers can be detected early and may often be prevented. Lung Cancer  You should be screened every year for lung cancer if:  You are a current smoker who has smoked for at  least 30 years.  You are a former smoker who has quit within the past 15 years.  Talk to your health care provider about your screening options, when you should start screening, and how often you should be screened. Colorectal Cancer  Routine colorectal cancer screening usually begins at 40 years of age and should be repeated every 5-10 years until you are 40 years old. You may need to be screened more often if early forms of precancerous polyps or small growths are found. Your health care provider may recommend screening at an earlier age if you have risk factors for colon cancer.  Your health care provider may recommend using home test kits to check for hidden blood in the stool.  A small camera at the end of a tube can be used to examine your colon (sigmoidoscopy or colonoscopy). This checks for the earliest forms of colorectal cancer. Prostate and Testicular Cancer  Depending on your age and overall health, your health care provider may do certain tests to screen for prostate and testicular cancer.  Talk to your health care provider about any symptoms or concerns you have about testicular or prostate cancer. Skin Cancer  Check your skin from head to toe regularly.  Tell your health care provider about any new moles or changes in moles, especially if:  There is a change in a mole's size, shape, or color.  You have a mole that is larger than a pencil eraser.  Always use sunscreen. Apply sunscreen liberally and repeat throughout the  day.  Protect yourself by wearing long sleeves, pants, a wide-brimmed hat, and sunglasses when outside. What should I know about heart disease, diabetes, and high blood pressure?  If you are 30-28 years of age, have your blood pressure checked every 3-5 years. If you are 12 years of age or older, have your blood pressure checked every year. You should have your blood pressure measured twice-once when you are at a hospital or clinic, and once when you are  not at a hospital or clinic. Record the average of the two measurements. To check your blood pressure when you are not at a hospital or clinic, you can use:  An automated blood pressure machine at a pharmacy.  A home blood pressure monitor.  Talk to your health care provider about your target blood pressure.  If you are between 65-3 years old, ask your health care provider if you should take aspirin to prevent heart disease.  Have regular diabetes screenings by checking your fasting blood sugar level.  If you are at a normal weight and have a low risk for diabetes, have this test once every three years after the age of 17.  If you are overweight and have a high risk for diabetes, consider being tested at a younger age or more often.  A one-time screening for abdominal aortic aneurysm (AAA) by ultrasound is recommended for men aged 26-75 years who are current or former smokers. What should I know about preventing infection? Hepatitis B  If you have a higher risk for hepatitis B, you should be screened for this virus. Talk with your health care provider to find out if you are at risk for hepatitis B infection. Hepatitis C  Blood testing is recommended for:  Everyone born from [birth year] through 1965.  Anyone with known risk factors for hepatitis C. Sexually Transmitted Diseases (STDs)  You should be screened each year for STDs including gonorrhea and chlamydia if:  You are sexually active and are younger than 40 years of age.  You are older than 40 years of age and your health care provider tells you that you are at risk for this type of infection.  Your sexual activity has changed since you were last screened and you are at an increased risk for chlamydia or gonorrhea. Ask your health care provider if you are at risk.  Talk with your health care provider about whether you are at high risk of being infected with HIV. Your health care provider may recommend a prescription medicine to help  prevent HIV infection. What else can I do?  Schedule regular health, dental, and eye exams.  Stay current with your vaccines (immunizations).  Do not use any tobacco products, such as cigarettes, chewing tobacco, and e-cigarettes. If you need help quitting, ask your health care provider.  Limit alcohol intake to no more than 2 drinks per day. One drink equals 12 ounces of beer, 5 ounces of wine, or 1 ounces of hard liquor.  Do not use street drugs.  Do not share needles.  Ask your health care provider for help if you need support or information about quitting drugs.  Tell your health care provider if you often feel depressed.  Tell your health care provider if you have ever been abused or do not feel safe at home. This information is not intended to replace advice given to you by your health care provider. Make sure you discuss any questions you have with your health care provider. Document Released: 09/21/2007  Document Revised: 11/22/2015 Document Reviewed: 12/27/2014 Elsevier Interactive Patient Education  2017 [employer].

## 2016-07-26 LAB — LEVETIRACETAM LEVEL: KEPPRA (LEVETIRACETAM): 12.1 ug/mL

## 2016-09-09 ENCOUNTER — Other Ambulatory Visit: Payer: Self-pay | Admitting: Internal Medicine

## 2016-11-17 ENCOUNTER — Other Ambulatory Visit: Payer: Self-pay | Admitting: Internal Medicine

## 2017-01-27 NOTE — Patient Instructions (Addendum)
  Test(s) ordered today. Your results will be released to [place] (or called to you) after review, usually within 72hours after test completion. If any changes need to be made, you will be notified at that same time.  All other Health Maintenance issues reviewed.   All recommended immunizations and age-appropriate screenings are up-to-date or discussed.  Flu immunization administered today.  A TB test was done.   Medications reviewed and updated.  No changes recommended at this time.   Please followup in 6 months

## 2017-01-27 NOTE — Progress Notes (Signed)
Subjective:    Patient ID: James Pitts, male    DOB: June 21, 1975, 41 y.o.   MRN: [id number]  HPI The patient is here for follow up.  Hypertension: He is taking his medication daily. He is compliant with a low sodium diet. He has changed his eating habits.   He denies chest pain, palpitations, edema, shortness of breath and regular headaches. He is exercising regularly.     Prediabetes:  He is compliant with a low sugar/carbohydrate diet.  He is exercising regularly.  He has been losing weight.    GERD:  He is taking his medication daily as prescribed.  He denies any GERD symptoms and feels his GERD is well controlled.    Medications and allergies reviewed with patient and updated if appropriate.  Patient Active Problem List   Diagnosis Date Noted  . Eczema 01/23/2016  . Prediabetes 03/12/2015  . Essential hypertension, benign 03/09/2015  . GERD (gastroesophageal reflux disease) 03/09/2015  . Seizures ([place]) 03/09/2015  . Obese 03/09/2015    Current Outpatient Prescriptions on File Prior to Visit  Medication Sig Dispense Refill  . clobetasol cream (TEMOVATE) 3.14 % Apply 1 application topically 2 (two) times daily. 30 g 3  . esomeprazole (NEXIUM) 20 MG capsule TAKE 1 CAPSULE DAILY AT 12 NOON 90 capsule 2  . hydrochlorothiazide (HYDRODIURIL) 12.5 MG tablet TAKE 1 TABLET DAILY 90 tablet 1  . levETIRAcetam (KEPPRA) 500 MG tablet TAKE 1 TABLET TWICE A DAY 180 tablet 1   No current facility-administered medications on file prior to visit.     Past Medical History:  Diagnosis Date  . Essential hypertension   . GERD (gastroesophageal reflux disease)   . Seizures ([place])     Past Surgical History:  Procedure Laterality Date  . tonsilecomy      Social History   Social History  . Marital status: Married    Spouse name: N/A  . Number of children: 4  . Years of education: N/A   Social History Main Topics  . Smoking status: Never Smoker  . Smokeless tobacco: Never Used    . Alcohol use 0.0 oz/week     Comment: rare  . Drug use: No  . Sexual activity: Not on file   Other Topics Concern  . Not on file   Social History Narrative   Occupational therapist    Family History  Problem Relation Age of Onset  . Hypertension Mother   . Multiple sclerosis Mother   . Hypertension Father   . Asthma Father     Review of Systems  Constitutional: Negative for chills and fever.  Respiratory: Negative for cough, shortness of breath and wheezing.   Cardiovascular: Negative for chest pain, palpitations and leg swelling.  Neurological: Negative for light-headedness and headaches.       Objective:   Vitals:   01/28/17 0845  BP: 124/86  Pulse: 80  Resp: 16  Temp: 98 F (36.7 C)  SpO2: 98%   Wt Readings from Last 3 Encounters:  01/28/17 282 lb (127.9 kg)  07/23/16 292 lb (132.5 kg)  01/23/16 291 lb (132 kg)   Body mass index is 35.25 kg/m.   Physical Exam    Constitutional: Appears well-developed and well-nourished. No distress.  HENT:  Head: Normocephalic and atraumatic.  Neck: Neck supple. No tracheal deviation present. No thyromegaly present.  No cervical lymphadenopathy Cardiovascular: Normal rate, regular rhythm and normal heart sounds.   No murmur heard. No carotid bruit .  No edema  Pulmonary/Chest: Effort normal and breath sounds normal. No respiratory distress. No has no wheezes. No rales.  Skin: Skin is warm and dry. Not diaphoretic.  Psychiatric: Normal mood and affect. Behavior is normal.      Assessment & Plan:    See Problem List for Assessment and Plan of chronic medical problems.   

## 2017-01-28 ENCOUNTER — Encounter: Payer: Self-pay | Admitting: Internal Medicine

## 2017-01-28 ENCOUNTER — Ambulatory Visit (INDEPENDENT_AMBULATORY_CARE_PROVIDER_SITE_OTHER): Payer: PRIVATE HEALTH INSURANCE | Admitting: Internal Medicine

## 2017-01-28 ENCOUNTER — Other Ambulatory Visit (INDEPENDENT_AMBULATORY_CARE_PROVIDER_SITE_OTHER): Payer: PRIVATE HEALTH INSURANCE

## 2017-01-28 VITALS — BP 124/86 | HR 80 | Temp 98.0°F | Resp 16 | Wt 282.0 lb

## 2017-01-28 DIAGNOSIS — I10 Essential (primary) hypertension: Secondary | ICD-10-CM

## 2017-01-28 DIAGNOSIS — Z111 Encounter for screening for respiratory tuberculosis: Secondary | ICD-10-CM

## 2017-01-28 DIAGNOSIS — R7303 Prediabetes: Secondary | ICD-10-CM

## 2017-01-28 DIAGNOSIS — Z23 Encounter for immunization: Secondary | ICD-10-CM

## 2017-01-28 DIAGNOSIS — K219 Gastro-esophageal reflux disease without esophagitis: Secondary | ICD-10-CM

## 2017-01-28 LAB — COMPREHENSIVE METABOLIC PANEL
ALT: 32 U/L (ref 0–53)
AST: 23 U/L (ref 0–37)
Albumin: 4.1 g/dL (ref 3.5–5.2)
Alkaline Phosphatase: 99 U/L (ref 39–117)
BILIRUBIN TOTAL: 0.4 mg/dL (ref 0.2–1.2)
BUN: 18 mg/dL (ref 6–23)
CALCIUM: 9.4 mg/dL (ref 8.4–10.5)
CO2: 31 meq/L (ref 19–32)
CREATININE: 1.24 mg/dL (ref 0.40–1.50)
Chloride: 99 mEq/L (ref 96–112)
GFR: 82.83 mL/min (ref >60.00)
Glucose, Bld: 93 mg/dL (ref 70–99)
Potassium: 4.9 mEq/L (ref 3.5–5.1)
Sodium: 136 mEq/L (ref 135–145)
Total Protein: 8.5 g/dL — ABNORMAL HIGH (ref 6.0–8.3)

## 2017-01-28 LAB — HEMOGLOBIN A1C: HEMOGLOBIN A1C: 5.9 % (ref 4.6–6.5)

## 2017-01-28 NOTE — Assessment & Plan Note (Signed)
GERD controlled Continue daily medication  

## 2017-01-28 NOTE — Assessment & Plan Note (Signed)
Check a1c Low sugar / carb diet Stressed regular exercise Continue weight loss efforts  

## 2017-01-28 NOTE — Assessment & Plan Note (Signed)
BP well controlled Current regimen effective and well tolerated Continue current medications at current doses cmp  

## 2017-01-30 ENCOUNTER — Encounter: Payer: Self-pay | Admitting: General Practice

## 2017-01-30 LAB — TB SKIN TEST
INDURATION: 0 mm
TB SKIN TEST: NEGATIVE

## 2017-03-08 ENCOUNTER — Other Ambulatory Visit: Payer: Self-pay | Admitting: Internal Medicine

## 2017-03-10 ENCOUNTER — Other Ambulatory Visit: Payer: Self-pay | Admitting: Internal Medicine

## 2017-05-17 ENCOUNTER — Other Ambulatory Visit: Payer: Self-pay | Admitting: Internal Medicine

## 2017-07-07 ENCOUNTER — Telehealth: Payer: Self-pay | Admitting: Internal Medicine

## 2017-07-07 MED ORDER — ESOMEPRAZOLE MAGNESIUM 20 MG PO CPDR: DELAYED_RELEASE_CAPSULE | ORAL | 0 refills | Status: DC

## 2017-07-07 MED ORDER — LEVETIRACETAM 500 MG PO TABS: 500.0000 mg | ORAL_TABLET | Freq: Two times a day (BID) | ORAL | 0 refills | Status: DC

## 2017-07-07 MED ORDER — HYDROCHLOROTHIAZIDE 12.5 MG PO TABS: 12.5000 mg | ORAL_TABLET | Freq: Every day | ORAL | 0 refills | Status: DC

## 2017-07-07 NOTE — Telephone Encounter (Signed)
Copied from [place] [phone] [id number]. Topic: Quick Communication - Rx Refill/Question >> Jul 07, 2017 10:19 AM Margot Ables wrote: Medication: pt needs meds sent to mail order pharmacy (90 day supply) - [facility] phone #  [phone]  nexium Hydrochlorothiazide keppra

## 2017-07-30 ENCOUNTER — Encounter: Payer: PRIVATE HEALTH INSURANCE | Admitting: Internal Medicine

## 2017-08-17 DIAGNOSIS — D649 Anemia, unspecified: Secondary | ICD-10-CM | POA: Insufficient documentation

## 2017-08-17 NOTE — Patient Instructions (Addendum)
Test(s) ordered today. Your results will be released to [place] (or called to you) after review, usually within 72hours after test completion. If any changes need to be made, you will be notified at that same time.  All other Health Maintenance issues reviewed.   All recommended immunizations and age-appropriate screenings are up-to-date or discussed.  No immunizations administered today.  A PPD or TB test was place today.   Medications reviewed and updated.  No changes recommended at this time.  Your prescription(s) have been submitted to your pharmacy. Please take as directed and contact our office if you believe you are having problem(s) with the medication(s).  A thyroid ultrasound was ordered - someone will call you to schedule this..    Please followup in 6 months    Health Maintenance, Male A healthy lifestyle and preventive care is important for your health and wellness. Ask your health care provider about what schedule of regular examinations is right for you. What should I know about weight and diet? Eat a Healthy Diet  Eat plenty of vegetables, fruits, whole grains, low-fat dairy products, and lean protein.  Do not eat a lot of foods high in solid fats, added sugars, or salt.  Maintain a Healthy Weight Regular exercise can help you achieve or maintain a healthy weight. You should:  Do at least 150 minutes of exercise each week. The exercise should increase your heart rate and make you sweat (moderate-intensity exercise).  Do strength-training exercises at least twice a week.  Watch Your Levels of Cholesterol and Blood Lipids  Have your blood tested for lipids and cholesterol every 5 years starting at 41 years of age. If you are at high risk for heart disease, you should start having your blood tested when you are 41 years old. You may need to have your cholesterol levels checked more often if: ? Your lipid or cholesterol levels are high. ? You are older than 41 years  of age. ? You are at high risk for heart disease.  What should I know about cancer screening? Many types of cancers can be detected early and may often be prevented. Lung Cancer  You should be screened every year for lung cancer if: ? You are a current smoker who has smoked for at least 30 years. ? You are a former smoker who has quit within the past 15 years.  Talk to your health care provider about your screening options, when you should start screening, and how often you should be screened.  Colorectal Cancer  Routine colorectal cancer screening usually begins at 41 years of age and should be repeated every 5-10 years until you are 41 years old. You may need to be screened more often if early forms of precancerous polyps or small growths are found. Your health care provider may recommend screening at an earlier age if you have risk factors for colon cancer.  Your health care provider may recommend using home test kits to check for hidden blood in the stool.  A small camera at the end of a tube can be used to examine your colon (sigmoidoscopy or colonoscopy). This checks for the earliest forms of colorectal cancer.  Prostate and Testicular Cancer  Depending on your age and overall health, your health care provider may do certain tests to screen for prostate and testicular cancer.  Talk to your health care provider about any symptoms or concerns you have about testicular or prostate cancer.  Skin Cancer  Check your skin from  head to toe regularly.  Tell your health care provider about any new moles or changes in moles, especially if: ? There is a change in a mole's size, shape, or color. ? You have a mole that is larger than a pencil eraser.  Always use sunscreen. Apply sunscreen liberally and repeat throughout the day.  Protect yourself by wearing long sleeves, pants, a wide-brimmed hat, and sunglasses when outside.  What should I know about heart disease, diabetes, and high  blood pressure?  If you are 67-4 years of age, have your blood pressure checked every 3-5 years. If you are 56 years of age or older, have your blood pressure checked every year. You should have your blood pressure measured twice-once when you are at a hospital or clinic, and once when you are not at a hospital or clinic. Record the average of the two measurements. To check your blood pressure when you are not at a hospital or clinic, you can use: ? An automated blood pressure machine at a pharmacy. ? A home blood pressure monitor.  Talk to your health care provider about your target blood pressure.  If you are between 29-36 years old, ask your health care provider if you should take aspirin to prevent heart disease.  Have regular diabetes screenings by checking your fasting blood sugar level. ? If you are at a normal weight and have a low risk for diabetes, have this test once every three years after the age of 71. ? If you are overweight and have a high risk for diabetes, consider being tested at a younger age or more often.  A one-time screening for abdominal aortic aneurysm (AAA) by ultrasound is recommended for men aged 64-75 years who are current or former smokers. What should I know about preventing infection? Hepatitis B If you have a higher risk for hepatitis B, you should be screened for this virus. Talk with your health care provider to find out if you are at risk for hepatitis B infection. Hepatitis C Blood testing is recommended for:  Everyone born from [birth year] through 1965.  Anyone with known risk factors for hepatitis C.  Sexually Transmitted Diseases (STDs)  You should be screened each year for STDs including gonorrhea and chlamydia if: ? You are sexually active and are younger than 41 years of age. ? You are older than 41 years of age and your health care provider tells you that you are at risk for this type of infection. ? Your sexual activity has changed since you were  last screened and you are at an increased risk for chlamydia or gonorrhea. Ask your health care provider if you are at risk.  Talk with your health care provider about whether you are at high risk of being infected with HIV. Your health care provider may recommend a prescription medicine to help prevent HIV infection.  What else can I do?  Schedule regular health, dental, and eye exams.  Stay current with your vaccines (immunizations).  Do not use any tobacco products, such as cigarettes, chewing tobacco, and e-cigarettes. If you need help quitting, ask your health care provider.  Limit alcohol intake to no more than 2 drinks per day. One drink equals 12 ounces of beer, 5 ounces of wine, or 1 ounces of hard liquor.  Do not use street drugs.  Do not share needles.  Ask your health care provider for help if you need support or information about quitting drugs.  Tell your health care provider  if you often feel depressed.  Tell your health care provider if you have ever been abused or do not feel safe at home. This information is not intended to replace advice given to you by your health care provider. Make sure you discuss any questions you have with your health care provider. Document Released: 09/21/2007 Document Revised: 11/22/2015 Document Reviewed: 12/27/2014 Elsevier Interactive Patient Education  [employer].

## 2017-08-17 NOTE — Progress Notes (Signed)
Subjective:    Patient ID: James Pitts, male    DOB: 02-25-1976, 42 y.o.   MRN: [id number]  HPI He is here for a physical exam.   He is getting over a cold and feels his symptoms have almost resolved.   He has no concerns.  He denies changes in his health.   Medications and allergies reviewed with patient and updated if appropriate.  Patient Active Problem List   Diagnosis Date Noted  . Anemia 08/17/2017  . Eczema 01/23/2016  . Prediabetes 03/12/2015  . Essential hypertension, benign 03/09/2015  . GERD (gastroesophageal reflux disease) 03/09/2015  . Seizures ([place]) 03/09/2015  . Obese 03/09/2015    Current Outpatient Medications on File Prior to Visit  Medication Sig Dispense Refill  . hydrochlorothiazide (HYDRODIURIL) 12.5 MG tablet Take 1 tablet (12.5 mg total) by mouth daily. 90 tablet 0  . levETIRAcetam (KEPPRA) 500 MG tablet Take 1 tablet (500 mg total) by mouth 2 (two) times daily. 180 tablet 0   No current facility-administered medications on file prior to visit.     Past Medical History:  Diagnosis Date  . Essential hypertension   . GERD (gastroesophageal reflux disease)   . Seizures ([place])     Past Surgical History:  Procedure Laterality Date  . tonsilecomy      Social History   Socioeconomic History  . Marital status: Married    Spouse name: Not on file  . Number of children: 4  . Years of education: Not on file  . Highest education level: Not on file  Occupational History  . Not on file  Social Needs  . Financial resource strain: Not on file  . Food insecurity:    Worry: Not on file    Inability: Not on file  . Transportation needs:    Medical: Not on file    Non-medical: Not on file  Tobacco Use  . Smoking status: Never Smoker  . Smokeless tobacco: Never Used  Substance and Sexual Activity  . Alcohol use: Yes    Alcohol/week: 0.0 oz    Comment: rare  . Drug use: No  . Sexual activity: Not on file  Lifestyle  . Physical activity:     Days per week: Not on file    Minutes per session: Not on file  . Stress: Not on file  Relationships  . Social connections:    Talks on phone: Not on file    Gets together: Not on file    Attends religious service: Not on file    Active member of club or organization: Not on file    Attends meetings of clubs or organizations: Not on file    Relationship status: Not on file  Other Topics Concern  . Not on file  Social History Narrative   Occupational therapist    Family History  Problem Relation Age of Onset  . Hypertension Mother   . Multiple sclerosis Mother   . Hypertension Father   . Asthma Father     Review of Systems  Constitutional: Negative for chills and fever.  HENT: Positive for congestion (mild). Negative for ear pain and sore throat.   Eyes: Negative for visual disturbance.  Respiratory: Positive for cough (residual from cold - minimally productive). Negative for shortness of breath and wheezing.   Cardiovascular: Negative for chest pain, palpitations and leg swelling.  Gastrointestinal: Negative for abdominal pain, blood in stool, constipation, diarrhea and nausea.  Genitourinary: Negative for difficulty urinating, dysuria and hematuria.  Musculoskeletal: Negative for arthralgias and back pain.  Skin: Negative for color change and rash.  Neurological: Negative for dizziness, seizures, light-headedness and headaches.  Psychiatric/Behavioral: Negative for dysphoric mood. The patient is not nervous/anxious.        Objective:   Vitals:   08/18/17 0839  BP: 120/86  Pulse: 77  Resp: 16  Temp: 98.1 F (36.7 C)  SpO2: 98%   Filed Weights   08/18/17 0839  Weight: 281 lb (127.5 kg)   Body mass index is 35.12 kg/m.  Wt Readings from Last 3 Encounters:  08/18/17 281 lb (127.5 kg)  01/28/17 282 lb (127.9 kg)  07/23/16 292 lb (132.5 kg)     Physical Exam Constitutional: He appears well-developed and well-nourished. No distress.  HENT:  Head:  Normocephalic and atraumatic.  Right Ear: External ear normal.  Left Ear: External ear normal.  Mouth/Throat: Oropharynx is clear and moist.  Normal ear canals and TM b/l  Eyes: Conjunctivae and EOM are normal.  Neck: Neck supple. No tracheal deviation present. Left sided thyromegaly present. No carotid bruit  Cardiovascular: Normal rate, regular rhythm, normal heart sounds and intact distal pulses.   No murmur heard. Pulmonary/Chest: Effort normal and breath sounds normal. No respiratory distress. He has no wheezes. He has no rales.  Abdominal: Soft. He exhibits no distension. There is no tenderness.  Genitourinary: deferred  Musculoskeletal: He exhibits no edema.  Lymphadenopathy:   He has no cervical adenopathy.  Skin: Skin is warm and dry. He is not diaphoretic.  Psychiatric: He has a normal mood and affect. His behavior is normal.         Assessment & Plan:   Physical exam: Screening blood work  ordered Immunizations   Up to date , needs PPD - placed Exercise   3 / week Weight  Working on weight loss Skin    No concerns Substance abuse  none  See Problem List for Assessment and Plan of chronic medical problems.

## 2017-08-18 ENCOUNTER — Ambulatory Visit (INDEPENDENT_AMBULATORY_CARE_PROVIDER_SITE_OTHER): Payer: 59 | Admitting: Internal Medicine

## 2017-08-18 ENCOUNTER — Encounter: Payer: Self-pay | Admitting: Internal Medicine

## 2017-08-18 ENCOUNTER — Other Ambulatory Visit (INDEPENDENT_AMBULATORY_CARE_PROVIDER_SITE_OTHER): Payer: 59

## 2017-08-18 VITALS — BP 120/86 | HR 77 | Temp 98.1°F | Resp 16 | Ht 75.0 in | Wt 281.0 lb

## 2017-08-18 DIAGNOSIS — R7303 Prediabetes: Secondary | ICD-10-CM | POA: Diagnosis not present

## 2017-08-18 DIAGNOSIS — R569 Unspecified convulsions: Secondary | ICD-10-CM

## 2017-08-18 DIAGNOSIS — E010 Iodine-deficiency related diffuse (endemic) goiter: Secondary | ICD-10-CM | POA: Diagnosis not present

## 2017-08-18 DIAGNOSIS — D649 Anemia, unspecified: Secondary | ICD-10-CM | POA: Diagnosis not present

## 2017-08-18 DIAGNOSIS — Z0001 Encounter for general adult medical examination with abnormal findings: Secondary | ICD-10-CM

## 2017-08-18 DIAGNOSIS — L309 Dermatitis, unspecified: Secondary | ICD-10-CM | POA: Diagnosis not present

## 2017-08-18 DIAGNOSIS — Z111 Encounter for screening for respiratory tuberculosis: Secondary | ICD-10-CM

## 2017-08-18 DIAGNOSIS — Z6836 Body mass index (BMI) 36.0-36.9, adult: Secondary | ICD-10-CM

## 2017-08-18 DIAGNOSIS — I10 Essential (primary) hypertension: Secondary | ICD-10-CM | POA: Diagnosis not present

## 2017-08-18 DIAGNOSIS — K219 Gastro-esophageal reflux disease without esophagitis: Secondary | ICD-10-CM | POA: Diagnosis not present

## 2017-08-18 LAB — COMPREHENSIVE METABOLIC PANEL
ALBUMIN: 4.2 g/dL (ref 3.5–5.2)
ALT: 45 U/L (ref 0–53)
AST: 28 U/L (ref 0–37)
Alkaline Phosphatase: 122 U/L — ABNORMAL HIGH (ref 39–117)
BUN: 18 mg/dL (ref 6–23)
CHLORIDE: 101 meq/L (ref 96–112)
CO2: 29 mEq/L (ref 19–32)
Calcium: 9.4 mg/dL (ref 8.4–10.5)
Creatinine, Ser: 1.24 mg/dL (ref 0.40–1.50)
GFR: 82.61 mL/min (ref >60.00)
Glucose, Bld: 100 mg/dL — ABNORMAL HIGH (ref 70–99)
POTASSIUM: 4.5 meq/L (ref 3.5–5.1)
Sodium: 137 mEq/L (ref 135–145)
Total Bilirubin: 0.4 mg/dL (ref 0.2–1.2)
Total Protein: 8.7 g/dL — ABNORMAL HIGH (ref 6.0–8.3)

## 2017-08-18 LAB — CBC WITH DIFFERENTIAL/PLATELET
BASOS PCT: 0.5 % (ref 0.0–3.0)
Basophils Absolute: 0 10*3/uL (ref 0.0–0.1)
EOS ABS: 0.3 10*3/uL (ref 0.0–0.7)
EOS PCT: 4.2 % (ref 0.0–5.0)
HEMATOCRIT: 38.9 % — AB (ref 39.0–52.0)
HEMOGLOBIN: 13.5 g/dL (ref 13.0–17.0)
LYMPHS PCT: 29.4 % (ref 12.0–46.0)
Lymphs Abs: 1.8 10*3/uL (ref 0.7–4.0)
MCHC: 34.7 g/dL (ref 30.0–36.0)
MCV: 94.7 fl (ref 78.0–100.0)
Monocytes Absolute: 0.5 10*3/uL (ref 0.1–1.0)
Monocytes Relative: 8.8 % (ref 3.0–12.0)
Neutro Abs: 3.6 10*3/uL (ref 1.4–7.7)
Neutrophils Relative %: 57.1 % (ref 43.0–77.0)
Platelets: 405 10*3/uL — ABNORMAL HIGH (ref 150.0–400.0)
RBC: 4.1 Mil/uL — ABNORMAL LOW (ref 4.22–5.81)
RDW: 12.6 % (ref 11.5–15.5)
WBC: 6.2 10*3/uL (ref 4.0–10.5)

## 2017-08-18 LAB — TSH: TSH: 1.72 u[IU]/mL (ref 0.35–4.50)

## 2017-08-18 LAB — LIPID PANEL
CHOLESTEROL: 156 mg/dL (ref 0–200)
HDL: 47 mg/dL (ref >39.00)
LDL CALC: 94 mg/dL (ref 0–99)
NonHDL: 108.52
TRIGLYCERIDES: 74 mg/dL (ref 0.0–149.0)
Total CHOL/HDL Ratio: 3
VLDL: 14.8 mg/dL (ref 0.0–40.0)

## 2017-08-18 LAB — FERRITIN: Ferritin: 100.1 ng/mL (ref 22.0–322.0)

## 2017-08-18 LAB — IRON: Iron: 63 ug/dL (ref 42–165)

## 2017-08-18 LAB — IBC PANEL
Iron: 63 ug/dL (ref 42–165)
Saturation Ratios: 17.7 % — ABNORMAL LOW (ref 20.0–50.0)
Transferrin: 254 mg/dL (ref 212.0–360.0)

## 2017-08-18 LAB — HEMOGLOBIN A1C: Hgb A1c MFr Bld: 5.8 % (ref 4.6–6.5)

## 2017-08-18 MED ORDER — HYDROCHLOROTHIAZIDE 12.5 MG PO TABS: 12.5000 mg | ORAL_TABLET | Freq: Every day | ORAL | 3 refills | Status: DC

## 2017-08-18 MED ORDER — LEVETIRACETAM 500 MG PO TABS: 500.0000 mg | ORAL_TABLET | Freq: Two times a day (BID) | ORAL | 3 refills | Status: DC

## 2017-08-18 NOTE — Assessment & Plan Note (Signed)
Check a1c Low sugar / carb diet Stressed regular exercise, weight loss  

## 2017-08-18 NOTE — Assessment & Plan Note (Signed)
Controlled Use steroid cream

## 2017-08-18 NOTE — Assessment & Plan Note (Signed)
Controlled, no seizure activity Continue keppra Not following with neuro - has been very stable keppra level today

## 2017-08-18 NOTE — Assessment & Plan Note (Signed)
Cbc, iron panel 

## 2017-08-18 NOTE — Assessment & Plan Note (Signed)
Has been able to get off nexium Takes otc meds prn for gerd, which is not regular

## 2017-08-18 NOTE — Assessment & Plan Note (Signed)
Left sided [place] ordered tsh

## 2017-08-18 NOTE — Assessment & Plan Note (Signed)
Exercising Working on weight loss

## 2017-08-18 NOTE — Assessment & Plan Note (Signed)
BP controlled - borderline high diastolic Current regimen effective and well tolerated Continue current medications at current doses Cmp, tsh, cbc

## 2017-08-20 LAB — TB SKIN TEST
Induration: 0 mm
TB SKIN TEST: NEGATIVE

## 2017-08-20 LAB — LEVETIRACETAM LEVEL: Keppra (Levetiracetam): 17.1 ug/mL

## 2017-08-25 ENCOUNTER — Ambulatory Visit
Admission: RE | Admit: 2017-08-25 | Discharge: 2017-08-25 | Disposition: A | Discharge status: Home / Self Care | Payer: 59 | Source: Ambulatory Visit | Attending: Internal Medicine | Admitting: Internal Medicine

## 2017-08-25 DIAGNOSIS — E010 Iodine-deficiency related diffuse (endemic) goiter: Secondary | ICD-10-CM

## 2017-08-25 DIAGNOSIS — Z85850 Personal history of malignant neoplasm of thyroid: Secondary | ICD-10-CM | POA: Diagnosis not present

## 2017-10-13 DIAGNOSIS — Z111 Encounter for screening for respiratory tuberculosis: Secondary | ICD-10-CM | POA: Diagnosis not present

## 2018-01-26 ENCOUNTER — Other Ambulatory Visit: Payer: Self-pay | Admitting: Internal Medicine

## 2018-01-26 MED ORDER — HYDROCHLOROTHIAZIDE 12.5 MG PO TABS: 12.5000 mg | ORAL_TABLET | Freq: Every day | ORAL | 0 refills | Status: DC

## 2018-01-26 NOTE — Telephone Encounter (Signed)
Medication: hydrochlorothiazide (HYDRODIURIL) 12.5 MG tablet  Pt's wife states they have request refill from [facility], but it will take at lest another week before it will arrive, they are requesting 7-10 pills be sent in to preferred pharmacy to get pt through until full rx arrives. Please advise    Preferred Pharmacy: [id number] - [place], [place] - [address]  [address] [place] [address]  Phone: [phone] Fax: [phone]        Pt was advised that RX refills may take up to 3 business days. Pt's wife requests a call if this requests is approved. [phone]

## 2018-02-16 NOTE — Progress Notes (Signed)
Subjective:    Patient ID: James Pitts, male    DOB: 04-14-1975, 42 y.o.   MRN: [id number]  HPI The patient is here for follow up.  Hypertension: He is taking his medication daily. He is compliant with a low sodium diet.  He denies chest pain, palpitations, edema, shortness of breath and regular headaches. He is not exercising regularly.      Prediabetes:  He is compliant with a low carbohydrate diet, but is eating too many sugars.  He is not exercising regularly.  Seizure d/o:  He is no longer following with neuro.  He is taking his medication daily.  He denies any seizures since his last visit.     Medications and allergies reviewed with patient and updated if appropriate.  Patient Active Problem List   Diagnosis Date Noted  . Thyromegaly 08/18/2017  . Anemia 08/17/2017  . Eczema 01/23/2016  . Prediabetes 03/12/2015  . Essential hypertension, benign 03/09/2015  . GERD (gastroesophageal reflux disease) 03/09/2015  . Seizures ([place]) 03/09/2015  . Obese 03/09/2015    Current Outpatient Medications on File Prior to Visit  Medication Sig Dispense Refill  . hydrochlorothiazide (HYDRODIURIL) 12.5 MG tablet Take 1 tablet (12.5 mg total) by mouth daily. 10 tablet 0  . levETIRAcetam (KEPPRA) 500 MG tablet Take 1 tablet (500 mg total) by mouth 2 (two) times daily. 180 tablet 3   No current facility-administered medications on file prior to visit.     Past Medical History:  Diagnosis Date  . Essential hypertension   . GERD (gastroesophageal reflux disease)   . Seizures ([place])     Past Surgical History:  Procedure Laterality Date  . tonsilecomy      Social History   Socioeconomic History  . Marital status: Married    Spouse name: Not on file  . Number of children: 4  . Years of education: Not on file  . Highest education level: Not on file  Occupational History  . Not on file  Social Needs  . Financial resource strain: Not on file  . Food insecurity:    Worry:  Not on file    Inability: Not on file  . Transportation needs:    Medical: Not on file    Non-medical: Not on file  Tobacco Use  . Smoking status: Never Smoker  . Smokeless tobacco: Never Used  Substance and Sexual Activity  . Alcohol use: Yes    Alcohol/week: 0.0 standard drinks    Comment: rare  . Drug use: No  . Sexual activity: Not on file  Lifestyle  . Physical activity:    Days per week: Not on file    Minutes per session: Not on file  . Stress: Not on file  Relationships  . Social connections:    Talks on phone: Not on file    Gets together: Not on file    Attends religious service: Not on file    Active member of club or organization: Not on file    Attends meetings of clubs or organizations: Not on file    Relationship status: Not on file  Other Topics Concern  . Not on file  Social History Narrative   Occupational therapist    Family History  Problem Relation Age of Onset  . Hypertension Mother   . Multiple sclerosis Mother   . Hypertension Father   . Asthma Father     Review of Systems  Constitutional: Negative for chills and fever.  Respiratory: Negative for  cough, shortness of breath and wheezing.   Cardiovascular: Negative for chest pain, palpitations and leg swelling.  Neurological: Negative for seizures, light-headedness and headaches.       Objective:   Vitals:   02/18/18 0851  BP: 126/88  Pulse: 65  Resp: 16  Temp: 98.4 F (36.9 C)  SpO2: 98%   BP Readings from Last 3 Encounters:  02/18/18 126/88  08/18/17 120/86  01/28/17 124/86   Wt Readings from Last 3 Encounters:  02/18/18 284 lb (128.8 kg)  08/18/17 281 lb (127.5 kg)  01/28/17 282 lb (127.9 kg)   Body mass index is 35.5 kg/m.   Physical Exam    Constitutional: Appears well-developed and well-nourished. No distress.  HENT:  Head: Normocephalic and atraumatic.  Neck: Neck supple. No tracheal deviation present. No thyromegaly present.  No cervical  lymphadenopathy Cardiovascular: Normal rate, regular rhythm and normal heart sounds.   No murmur heard. No carotid bruit .  No edema Pulmonary/Chest: Effort normal and breath sounds normal. No respiratory distress. No has no wheezes. No rales.  Skin: Skin is warm and dry. Not diaphoretic.  Psychiatric: Normal mood and affect. Behavior is normal.      Assessment & Plan:    See Problem List for Assessment and Plan of chronic medical problems.

## 2018-02-18 ENCOUNTER — Ambulatory Visit: Payer: 59 | Admitting: Internal Medicine

## 2018-02-18 ENCOUNTER — Encounter: Payer: Self-pay | Admitting: Internal Medicine

## 2018-02-18 ENCOUNTER — Other Ambulatory Visit (INDEPENDENT_AMBULATORY_CARE_PROVIDER_SITE_OTHER): Payer: 59

## 2018-02-18 VITALS — BP 126/88 | HR 65 | Temp 98.4°F | Resp 16 | Ht 75.0 in | Wt 284.0 lb

## 2018-02-18 DIAGNOSIS — R7303 Prediabetes: Secondary | ICD-10-CM

## 2018-02-18 DIAGNOSIS — D649 Anemia, unspecified: Secondary | ICD-10-CM

## 2018-02-18 DIAGNOSIS — I10 Essential (primary) hypertension: Secondary | ICD-10-CM | POA: Diagnosis not present

## 2018-02-18 DIAGNOSIS — Z23 Encounter for immunization: Secondary | ICD-10-CM | POA: Diagnosis not present

## 2018-02-18 DIAGNOSIS — R569 Unspecified convulsions: Secondary | ICD-10-CM

## 2018-02-18 LAB — CBC WITH DIFFERENTIAL/PLATELET
BASOS ABS: 0.1 10*3/uL (ref 0.0–0.1)
Basophils Relative: 0.9 % (ref 0.0–3.0)
EOS ABS: 0.4 10*3/uL (ref 0.0–0.7)
Eosinophils Relative: 6.9 % — ABNORMAL HIGH (ref 0.0–5.0)
HEMATOCRIT: 37.5 % — AB (ref 39.0–52.0)
HEMOGLOBIN: 13.2 g/dL (ref 13.0–17.0)
LYMPHS PCT: 26.7 % (ref 12.0–46.0)
Lymphs Abs: 1.7 10*3/uL (ref 0.7–4.0)
MCHC: 35.1 g/dL (ref 30.0–36.0)
MCV: 97.9 fl (ref 78.0–100.0)
MONO ABS: 0.5 10*3/uL (ref 0.1–1.0)
Monocytes Relative: 8.6 % (ref 3.0–12.0)
NEUTROS PCT: 56.9 % (ref 43.0–77.0)
Neutro Abs: 3.5 10*3/uL (ref 1.4–7.7)
PLATELETS: 382 10*3/uL (ref 150.0–400.0)
RBC: 3.83 Mil/uL — ABNORMAL LOW (ref 4.22–5.81)
RDW: 13.2 % (ref 11.5–15.5)
WBC: 6.2 10*3/uL (ref 4.0–10.5)

## 2018-02-18 LAB — COMPREHENSIVE METABOLIC PANEL
ALT: 30 U/L (ref 0–53)
AST: 22 U/L (ref 0–37)
Albumin: 4.2 g/dL (ref 3.5–5.2)
Alkaline Phosphatase: 103 U/L (ref 39–117)
BUN: 18 mg/dL (ref 6–23)
CHLORIDE: 103 meq/L (ref 96–112)
CO2: 28 meq/L (ref 19–32)
Calcium: 9.5 mg/dL (ref 8.4–10.5)
Creatinine, Ser: 1.25 mg/dL (ref 0.40–1.50)
GFR: 81.64 mL/min (ref >60.00)
GLUCOSE: 95 mg/dL (ref 70–99)
POTASSIUM: 4.3 meq/L (ref 3.5–5.1)
SODIUM: 138 meq/L (ref 135–145)
Total Bilirubin: 0.5 mg/dL (ref 0.2–1.2)
Total Protein: 8.4 g/dL — ABNORMAL HIGH (ref 6.0–8.3)

## 2018-02-18 LAB — IRON,TIBC AND FERRITIN PANEL
%SAT: 26 % (ref 20–48)
FERRITIN: 108 ng/mL (ref 38–380)
Iron: 85 ug/dL (ref 50–180)
TIBC: 325 ug/dL (ref 250–425)

## 2018-02-18 LAB — HEMOGLOBIN A1C: Hgb A1c MFr Bld: 5.9 % (ref 4.6–6.5)

## 2018-02-18 NOTE — Assessment & Plan Note (Signed)
Check a1c Low sugar / carb diet Stressed regular exercise, weight loss  

## 2018-02-18 NOTE — Assessment & Plan Note (Signed)
No longer following with neurology No seizure activity since his last visit Continue Keppra at its current dose

## 2018-02-18 NOTE — Assessment & Plan Note (Signed)
BP well controlled Current regimen effective and well tolerated Continue current medications at current doses cmp  

## 2018-02-18 NOTE — Patient Instructions (Addendum)
  Tests ordered today. Your results will be released to MyChart (or called to you) after review, usually within 72hours after test completion. If any changes need to be made, you will be notified at that same time.  Flu immunization administered today.    Medications reviewed and updated.  Changes include :   none    Please followup in 6 months   

## 2018-02-18 NOTE — Assessment & Plan Note (Signed)
cbc

## 2018-02-19 ENCOUNTER — Encounter: Payer: Self-pay | Admitting: Internal Medicine

## 2018-08-21 ENCOUNTER — Encounter: Payer: 59 | Admitting: Internal Medicine

## 2018-11-10 NOTE — Progress Notes (Signed)
Subjective:    Patient ID: James Pitts, male    DOB: 02-May-1975, 43 y.o.   MRN: [id number]  HPI He is here for a physical exam.   His libido is lower.   He is walking about 2 miles a day.  His weight has been stable.   He has no concerns.   Medications and allergies reviewed with patient and updated if appropriate.  Patient Active Problem List   Diagnosis Date Noted  . Thyromegaly 08/18/2017  . Eczema 01/23/2016  . Prediabetes 03/12/2015  . Essential hypertension, benign 03/09/2015  . Seizures ([place]) 03/09/2015  . Obese 03/09/2015    Current Outpatient Medications on File Prior to Visit  Medication Sig Dispense Refill  . hydrochlorothiazide (HYDRODIURIL) 12.5 MG tablet Take 1 tablet (12.5 mg total) by mouth daily. 10 tablet 0  . levETIRAcetam (KEPPRA) 500 MG tablet Take 1 tablet (500 mg total) by mouth 2 (two) times daily. 180 tablet 3   No current facility-administered medications on file prior to visit.     Past Medical History:  Diagnosis Date  . Essential hypertension   . GERD (gastroesophageal reflux disease)   . Seizures ([place])     Past Surgical History:  Procedure Laterality Date  . tonsilecomy      Social History   Socioeconomic History  . Marital status: Married    Spouse name: Not on file  . Number of children: 4  . Years of education: Not on file  . Highest education level: Not on file  Occupational History  . Not on file  Social Needs  . Financial resource strain: Not on file  . Food insecurity    Worry: Not on file    Inability: Not on file  . Transportation needs    Medical: Not on file    Non-medical: Not on file  Tobacco Use  . Smoking status: Never Smoker  . Smokeless tobacco: Never Used  Substance and Sexual Activity  . Alcohol use: Yes    Alcohol/week: 0.0 standard drinks    Comment: rare  . Drug use: No  . Sexual activity: Not on file  Lifestyle  . Physical activity    Days per week: Not on file    Minutes per session:  Not on file  . Stress: Not on file  Relationships  . Social Herbalist on phone: Not on file    Gets together: Not on file    Attends religious service: Not on file    Active member of club or organization: Not on file    Attends meetings of clubs or organizations: Not on file    Relationship status: Not on file  Other Topics Concern  . Not on file  Social History Narrative   Occupational therapist    Family History  Problem Relation Age of Onset  . Hypertension Mother   . Multiple sclerosis Mother   . Hypertension Father   . Asthma Father     Review of Systems  Constitutional: Negative for chills and fever.  Eyes: Negative for visual disturbance.  Respiratory: Negative for cough, shortness of breath and wheezing.   Cardiovascular: Negative for chest pain, palpitations and leg swelling.  Gastrointestinal: Negative for abdominal pain, blood in stool, constipation, diarrhea and nausea.       No gerd  Genitourinary: Negative for difficulty urinating, dysuria and hematuria.  Musculoskeletal: Negative for arthralgias and back pain.  Skin: Negative for color change and rash.  Neurological: Negative for  dizziness, light-headedness, numbness and headaches.  Psychiatric/Behavioral: Negative for dysphoric mood. The patient is not nervous/anxious.        Objective:   Vitals:   11/11/18 0902  BP: (!) 144/98  Pulse: 68  Resp: 16  Temp: 98.3 F (36.8 C)  SpO2: 99%   Filed Weights   11/11/18 0902  Weight: 283 lb (128.4 kg)   Body mass index is 35.37 kg/m.  BP Readings from Last 3 Encounters:  11/11/18 (!) 144/98  02/18/18 126/88  08/18/17 120/86    Wt Readings from Last 3 Encounters:  11/11/18 283 lb (128.4 kg)  02/18/18 284 lb (128.8 kg)  08/18/17 281 lb (127.5 kg)     Physical Exam Constitutional: He appears well-developed and well-nourished. No distress.  HENT:  Head: Normocephalic and atraumatic.  Right Ear: External ear normal.  Left Ear:  External ear normal.  Mouth/Throat: Oropharynx is clear and moist.  Normal ear canals and TM b/l  Eyes: Conjunctivae and EOM are normal.  Neck: Neck supple. No tracheal deviation present. Mild thyromegaly present.  No carotid bruit  Cardiovascular: Normal rate, regular rhythm, normal heart sounds and intact distal pulses.   No murmur heard. Pulmonary/Chest: Effort normal and breath sounds normal. No respiratory distress. He has no wheezes. He has no rales.  Abdominal: Soft. He exhibits no distension. There is no tenderness.  Genitourinary: deferred  Musculoskeletal: He exhibits no edema.  Lymphadenopathy:   He has no cervical adenopathy.  Skin: Skin is warm and dry. He is not diaphoretic.  Psychiatric: He has a normal mood and affect. His behavior is normal.         Assessment & Plan:   Physical exam: Screening blood work   ordered Immunizations   tdap up to date Exercise  Walking 2 miles a day Weight  Advised weight loss Skin   no concerns Substance abuse   none  See Problem List for Assessment and Plan of chronic medical problems.   FU in 6 months

## 2018-11-10 NOTE — Patient Instructions (Addendum)
Tests ordered today. Your results will be released to [place] (or called to you) after review.  If any changes need to be made, you will be notified at that same time.  All other Health Maintenance issues reviewed.   All recommended immunizations and age-appropriate screenings are up-to-date or discussed.  No immunization administered today.   Medications reviewed and updated.  Changes include :   Increase hydrochlorothiazide to 25 mg daily  Your prescription(s) have been submitted to your pharmacy. Please take as directed and contact our office if you believe you are having problem(s) with the medication(s).   Please followup in 6 months    Health Maintenance, Male Adopting a healthy lifestyle and getting preventive care are important in promoting health and wellness. Ask your health care provider about:  The right schedule for you to have regular tests and exams.  Things you can do on your own to prevent diseases and keep yourself healthy. What should I know about diet, weight, and exercise? Eat a healthy diet   Eat a diet that includes plenty of vegetables, fruits, low-fat dairy products, and lean protein.  Do not eat a lot of foods that are high in solid fats, added sugars, or sodium. Maintain a healthy weight Body mass index (BMI) is a measurement that can be used to identify possible weight problems. It estimates body fat based on height and weight. Your health care provider can help determine your BMI and help you achieve or maintain a healthy weight. Get regular exercise Get regular exercise. This is one of the most important things you can do for your health. Most adults should:  Exercise for at least 150 minutes each week. The exercise should increase your heart rate and make you sweat (moderate-intensity exercise).  Do strengthening exercises at least twice a week. This is in addition to the moderate-intensity exercise.  Spend less time sitting. Even light physical  activity can be beneficial. Watch cholesterol and blood lipids Have your blood tested for lipids and cholesterol at 42 years of age, then have this test every 5 years. You may need to have your cholesterol levels checked more often if:  Your lipid or cholesterol levels are high.  You are older than 42 years of age.  You are at high risk for heart disease. What should I know about cancer screening? Many types of cancers can be detected early and may often be prevented. Depending on your health history and family history, you may need to have cancer screening at various ages. This may include screening for:  Colorectal cancer.  Prostate cancer.  Skin cancer.  Lung cancer. What should I know about heart disease, diabetes, and high blood pressure? Blood pressure and heart disease  High blood pressure causes heart disease and increases the risk of stroke. This is more likely to develop in people who have high blood pressure readings, are of African descent, or are overweight.  Talk with your health care provider about your target blood pressure readings.  Have your blood pressure checked: ? Every 3-5 years if you are 57-63 years of age. ? Every year if you are 40 years old or older.  If you are between the ages of 77 and 26 and are a current or former smoker, ask your health care provider if you should have a one-time screening for abdominal aortic aneurysm (AAA). Diabetes Have regular diabetes screenings. This checks your fasting blood sugar level. Have the screening done:  Once every three years after age  45 if you are at a normal weight and have a low risk for diabetes.  More often and at a younger age if you are overweight or have a high risk for diabetes. What should I know about preventing infection? Hepatitis B If you have a higher risk for hepatitis B, you should be screened for this virus. Talk with your health care provider to find out if you are at risk for hepatitis B  infection. Hepatitis C Blood testing is recommended for:  Everyone born from [birth year] through 1964.  Anyone with known risk factors for hepatitis C. Sexually transmitted infections (STIs)  You should be screened each year for STIs, including gonorrhea and chlamydia, if: ? You are sexually active and are younger than 42 years of age. ? You are older than 42 years of age and your health care provider tells you that you are at risk for this type of infection. ? Your sexual activity has changed since you were last screened, and you are at increased risk for chlamydia or gonorrhea. Ask your health care provider if you are at risk.  Ask your health care provider about whether you are at high risk for HIV. Your health care provider may recommend a prescription medicine to help prevent HIV infection. If you choose to take medicine to prevent HIV, you should first get tested for HIV. You should then be tested every 3 months for as long as you are taking the medicine. Follow these instructions at home: Lifestyle  Do not use any products that contain nicotine or tobacco, such as cigarettes, e-cigarettes, and chewing tobacco. If you need help quitting, ask your health care provider.  Do not use street drugs.  Do not share needles.  Ask your health care provider for help if you need support or information about quitting drugs. Alcohol use  Do not drink alcohol if your health care provider tells you not to drink.  If you drink alcohol: ? Limit how much you have to 0-2 drinks a day. ? Be aware of how much alcohol is in your drink. In the [place] one drink equals one 12 oz bottle of beer (355 mL), one 5 oz glass of wine (148 mL), or one 1 oz glass of hard liquor (44 mL). General instructions  Schedule regular health, dental, and eye exams.  Stay current with your vaccines.  Tell your health care provider if: ? You often feel depressed. ? You have ever been abused or do not feel safe at home.  Summary  Adopting a healthy lifestyle and getting preventive care are important in promoting health and wellness.  Follow your health care provider's instructions about healthy diet, exercising, and getting tested or screened for diseases.  Follow your health care provider's instructions on monitoring your cholesterol and blood pressure. This information is not intended to replace advice given to you by your health care provider. Make sure you discuss any questions you have with your health care provider. Document Released: 09/21/2007 Document Revised: 03/18/2018 Document Reviewed: 03/18/2018 Elsevier Patient Education  2020 [employer].

## 2018-11-11 ENCOUNTER — Ambulatory Visit (INDEPENDENT_AMBULATORY_CARE_PROVIDER_SITE_OTHER): Payer: 59 | Admitting: Internal Medicine

## 2018-11-11 ENCOUNTER — Encounter: Payer: Self-pay | Admitting: Internal Medicine

## 2018-11-11 ENCOUNTER — Other Ambulatory Visit (INDEPENDENT_AMBULATORY_CARE_PROVIDER_SITE_OTHER): Payer: 59

## 2018-11-11 ENCOUNTER — Other Ambulatory Visit: Payer: Self-pay

## 2018-11-11 VITALS — BP 144/98 | HR 68 | Temp 98.3°F | Resp 16 | Ht 75.0 in | Wt 283.0 lb

## 2018-11-11 DIAGNOSIS — R569 Unspecified convulsions: Secondary | ICD-10-CM

## 2018-11-11 DIAGNOSIS — E66812 Obesity, class 2: Secondary | ICD-10-CM

## 2018-11-11 DIAGNOSIS — E010 Iodine-deficiency related diffuse (endemic) goiter: Secondary | ICD-10-CM

## 2018-11-11 DIAGNOSIS — I10 Essential (primary) hypertension: Secondary | ICD-10-CM | POA: Diagnosis not present

## 2018-11-11 DIAGNOSIS — R7303 Prediabetes: Secondary | ICD-10-CM

## 2018-11-11 DIAGNOSIS — Z0000 Encounter for general adult medical examination without abnormal findings: Secondary | ICD-10-CM

## 2018-11-11 DIAGNOSIS — Z6835 Body mass index (BMI) 35.0-35.9, adult: Secondary | ICD-10-CM

## 2018-11-11 LAB — COMPREHENSIVE METABOLIC PANEL
ALT: 34 U/L (ref 0–53)
AST: 26 U/L (ref 0–37)
Albumin: 4.5 g/dL (ref 3.5–5.2)
Alkaline Phosphatase: 110 U/L (ref 39–117)
BUN: 12 mg/dL (ref 6–23)
CO2: 28 mEq/L (ref 19–32)
Calcium: 9.8 mg/dL (ref 8.4–10.5)
Chloride: 101 mEq/L (ref 96–112)
Creatinine, Ser: 1.21 mg/dL (ref 0.40–1.50)
GFR: 79.47 mL/min (ref >60.00)
Glucose, Bld: 92 mg/dL (ref 70–99)
Potassium: 4.3 mEq/L (ref 3.5–5.1)
Sodium: 136 mEq/L (ref 135–145)
Total Bilirubin: 0.5 mg/dL (ref 0.2–1.2)
Total Protein: 9 g/dL — ABNORMAL HIGH (ref 6.0–8.3)

## 2018-11-11 LAB — CBC WITH DIFFERENTIAL/PLATELET
Basophils Absolute: 0 10*3/uL (ref 0.0–0.1)
Basophils Relative: 0.6 % (ref 0.0–3.0)
Eosinophils Absolute: 0.3 10*3/uL (ref 0.0–0.7)
Eosinophils Relative: 5.3 % — ABNORMAL HIGH (ref 0.0–5.0)
HCT: 41.4 % (ref 39.0–52.0)
Hemoglobin: 13.9 g/dL (ref 13.0–17.0)
Lymphocytes Relative: 26.1 % (ref 12.0–46.0)
Lymphs Abs: 1.6 10*3/uL (ref 0.7–4.0)
MCHC: 33.6 g/dL (ref 30.0–36.0)
MCV: 94.4 fl (ref 78.0–100.0)
Monocytes Absolute: 0.6 10*3/uL (ref 0.1–1.0)
Monocytes Relative: 9.2 % (ref 3.0–12.0)
Neutro Abs: 3.6 10*3/uL (ref 1.4–7.7)
Neutrophils Relative %: 58.8 % (ref 43.0–77.0)
Platelets: 365 10*3/uL (ref 150.0–400.0)
RBC: 4.39 Mil/uL (ref 4.22–5.81)
RDW: 13 % (ref 11.5–15.5)
WBC: 6.1 10*3/uL (ref 4.0–10.5)

## 2018-11-11 LAB — LIPID PANEL
Cholesterol: 173 mg/dL (ref 0–200)
HDL: 52.3 mg/dL (ref >39.00)
LDL Cholesterol: 104 mg/dL — ABNORMAL HIGH (ref 0–99)
NonHDL: 120.94
Total CHOL/HDL Ratio: 3
Triglycerides: 83 mg/dL (ref 0.0–149.0)
VLDL: 16.6 mg/dL (ref 0.0–40.0)

## 2018-11-11 LAB — TSH: TSH: 2.03 u[IU]/mL (ref 0.35–4.50)

## 2018-11-11 LAB — HEMOGLOBIN A1C: Hgb A1c MFr Bld: 5.9 % (ref 4.6–6.5)

## 2018-11-11 MED ORDER — HYDROCHLOROTHIAZIDE 25 MG PO TABS: 25.0000 mg | ORAL_TABLET | Freq: Every day | ORAL | 1 refills | Status: DC

## 2018-11-11 MED ORDER — LEVETIRACETAM 500 MG PO TABS: 500.0000 mg | ORAL_TABLET | Freq: Two times a day (BID) | ORAL | 1 refills | Status: DC

## 2018-11-11 NOTE — Assessment & Plan Note (Signed)
With htn, prediabetes Inc exercise Decrease portions, healthy diet Stressed weight loss to improve sugars, htn and libido

## 2018-11-11 NOTE — Assessment & Plan Note (Signed)
Check a1c Low sugar / carb diet Stressed regular exercise   

## 2018-11-11 NOTE — Assessment & Plan Note (Signed)
Not ideally controlled Increase hctz to 25 mg daily Low sodium diet Increase exercise Work on weight loss cmp

## 2018-11-11 NOTE — Assessment & Plan Note (Addendum)
No seizure activity Stable on keppra Continue keppra at current dose

## 2018-11-11 NOTE — Assessment & Plan Note (Signed)
[place] 2019 normal tsh

## 2018-11-14 ENCOUNTER — Encounter: Payer: Self-pay | Admitting: Internal Medicine

## 2019-04-30 ENCOUNTER — Other Ambulatory Visit: Payer: Self-pay

## 2019-04-30 MED ORDER — LEVETIRACETAM 500 MG PO TABS: 500.0000 mg | ORAL_TABLET | Freq: Two times a day (BID) | ORAL | 0 refills | Status: DC

## 2019-05-13 NOTE — Progress Notes (Signed)
Virtual Visit via Video Note  I connected with James Pitts on 05/14/19 at  8:30 AM EST by a video enabled telemedicine application and verified that I am speaking with the correct person using two identifiers.   I discussed the limitations of evaluation and management by telemedicine and the availability of in person appointments. The patient expressed understanding and agreed to proceed.  Present for the visit:  Myself, Dr Billey Gosling, James Pitts.  The patient is currently at home and I am in the office.    No referring provider.    History of Present Illness: He is here for follow up of his chronic medical conditions.   He is exercising regularly.    He has been working on weight loss and is down to 275.    Hypertension: He is taking his medication daily. He is compliant with a low sodium diet.  He denies chest pain, palpitations, edema, shortness of breath and regular headaches.  He does monitor his blood pressure at home -130/88.    Prediabetes:  He is compliant with a low sugar/carbohydrate diet.  He is exercising regularly.  Seizure d/o:  He is taking his medication daily.  He no longer follows with neurology.  He denies seizure-like activity since his last visit.   Review of Systems  Constitutional: Negative for chills and fever.  Respiratory: Negative for cough, shortness of breath and wheezing.   Cardiovascular: Negative for chest pain, palpitations and leg swelling.  Neurological: Negative for dizziness and headaches.     Social History   Socioeconomic History  . Marital status: Married    Spouse name: Not on file  . Number of children: 4  . Years of education: Not on file  . Highest education level: Not on file  Occupational History  . Not on file  Tobacco Use  . Smoking status: Never Smoker  . Smokeless tobacco: Never Used  Substance and Sexual Activity  . Alcohol use: Yes    Alcohol/week: 0.0 standard drinks    Comment: rare  . Drug use: No  . Sexual  activity: Not on file  Other Topics Concern  . Not on file  Social History Narrative   Occupational therapist   Social Determinants of Health   Financial Resource Strain:   . Difficulty of Paying Living Expenses: Not on file  Food Insecurity:   . Worried About Charity fundraiser in the Last Year: Not on file  . Ran Out of Food in the Last Year: Not on file  Transportation Needs:   . Lack of Transportation (Medical): Not on file  . Lack of Transportation (Non-Medical): Not on file  Physical Activity:   . Days of Exercise per Week: Not on file  . Minutes of Exercise per Session: Not on file  Stress:   . Feeling of Stress : Not on file  Social Connections:   . Frequency of Communication with Friends and Family: Not on file  . Frequency of Social Gatherings with Friends and Family: Not on file  . Attends Religious Services: Not on file  . Active Member of Clubs or Organizations: Not on file  . Attends Archivist Meetings: Not on file  . Marital Status: Not on file     Observations/Objective: Appears well in NAD Breathing normally Skin appears warm and dry Mood and affect normal  Assessment and Plan:  See Problem List for Assessment and Plan of chronic medical problems.   Follow Up Instructions:  I discussed the assessment and treatment plan with the patient. The patient was provided an opportunity to ask questions and all were answered. The patient agreed with the plan and demonstrated an understanding of the instructions.   The patient was advised to call back or seek an in-person evaluation if the symptoms worsen or if the condition fails to improve as anticipated.  FU in 6 months for CPE  Binnie Rail, MD

## 2019-05-14 ENCOUNTER — Encounter: Payer: Self-pay | Admitting: Internal Medicine

## 2019-05-14 ENCOUNTER — Ambulatory Visit (INDEPENDENT_AMBULATORY_CARE_PROVIDER_SITE_OTHER): Payer: Self-pay | Admitting: Internal Medicine

## 2019-05-14 DIAGNOSIS — R7303 Prediabetes: Secondary | ICD-10-CM

## 2019-05-14 DIAGNOSIS — Z6835 Body mass index (BMI) 35.0-35.9, adult: Secondary | ICD-10-CM

## 2019-05-14 DIAGNOSIS — R569 Unspecified convulsions: Secondary | ICD-10-CM

## 2019-05-14 DIAGNOSIS — I10 Essential (primary) hypertension: Secondary | ICD-10-CM

## 2019-05-14 MED ORDER — HYDROCHLOROTHIAZIDE 25 MG PO TABS: 25.0000 mg | ORAL_TABLET | Freq: Every day | ORAL | 1 refills | Status: DC

## 2019-05-14 MED ORDER — LEVETIRACETAM 500 MG PO TABS: 500.0000 mg | ORAL_TABLET | Freq: Two times a day (BID) | ORAL | 1 refills | Status: DC

## 2019-05-14 NOTE — Assessment & Plan Note (Signed)
Chronic BP borderline high at home  Current regimen effective and well tolerated Continue current medications at current doses Working on weight loss, exercising - encouraged to continue efforts

## 2019-05-14 NOTE — Assessment & Plan Note (Signed)
With hypertension, prediabetes He has lost weight and is continuing his efforts to lose weight Continue regular exercise Follow-up in 6 months

## 2019-05-14 NOTE — Assessment & Plan Note (Signed)
Chronic Low sugar / carb diet Stressed regular exercise Encouraged continuing weight loss efforts We will check A1c at his next visit since his sugars have been controlled

## 2019-05-14 NOTE — Assessment & Plan Note (Signed)
Chronic No seizure-like activity No longer following with neurology because of stability Continue Keppra at current dose-refill sent to pharmacy

## 2019-05-24 IMAGING — US US THYROID
1 series · 14 of 25 positions shown · non-contrast
Comparison: None.

CLINICAL DATA: 42-year-old male with a history of thyroid
enlargement

EXAM:
THYROID ULTRASOUND
TECHNIQUE: Ultrasound examination of the thyroid gland and adjacent soft
tissues was performed.

[Series 1: us thyroid · 0.06mm/px · 14 of 46 slices shown]
[im 1/46]
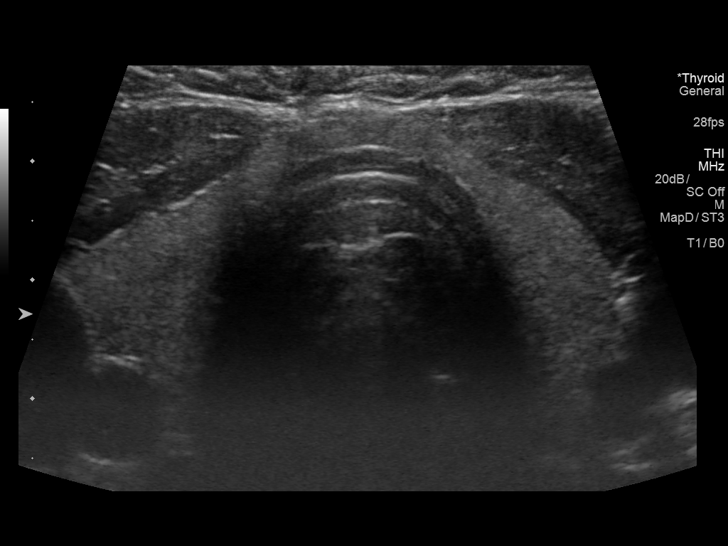
[im 4/46]
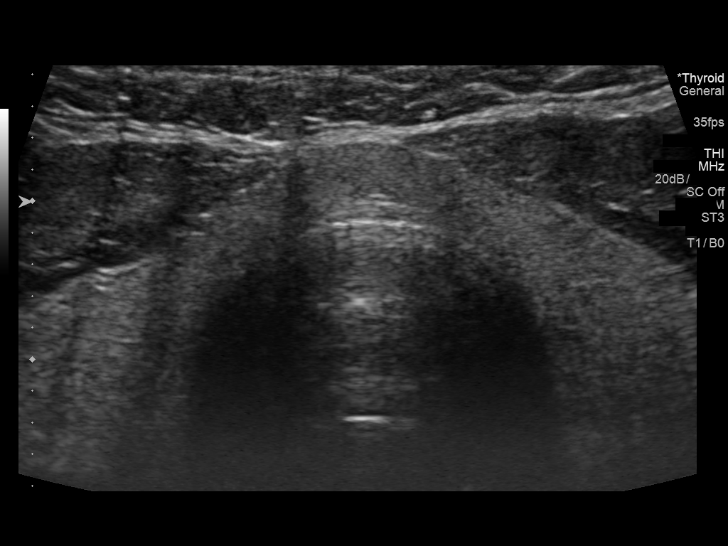
[im 8/46]
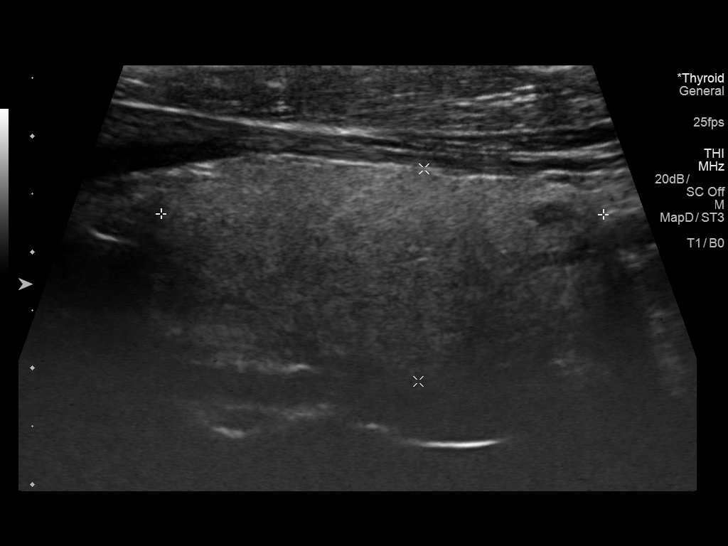
[im 12/46]
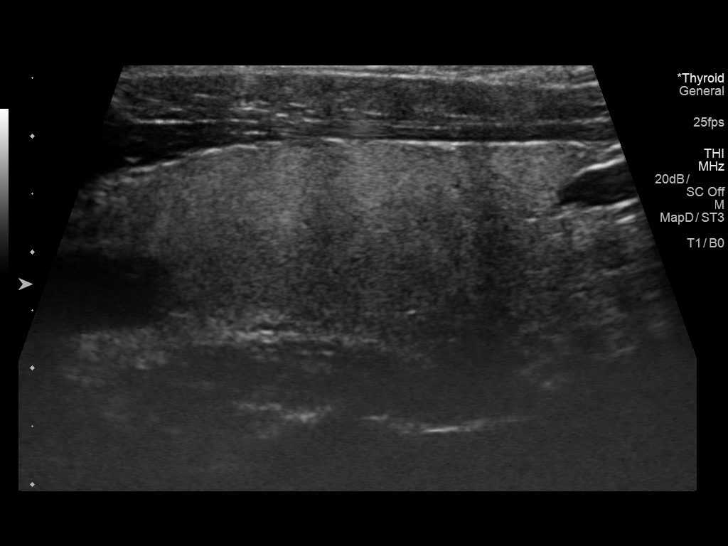
[im 16/46]
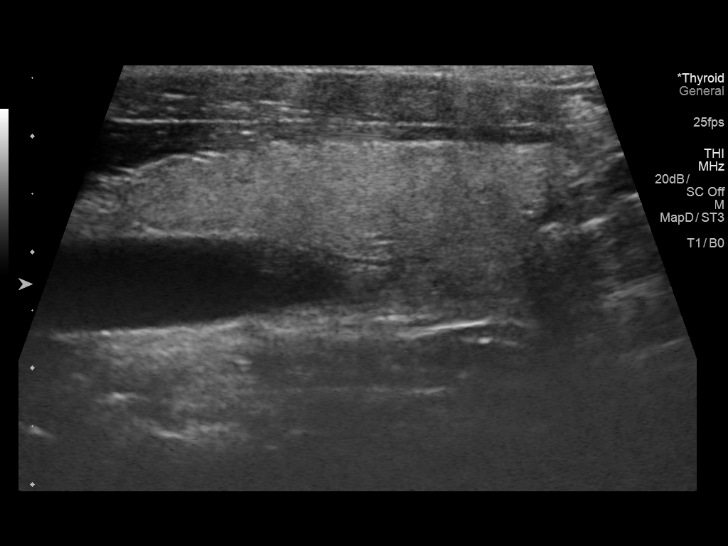
[im 17/46]
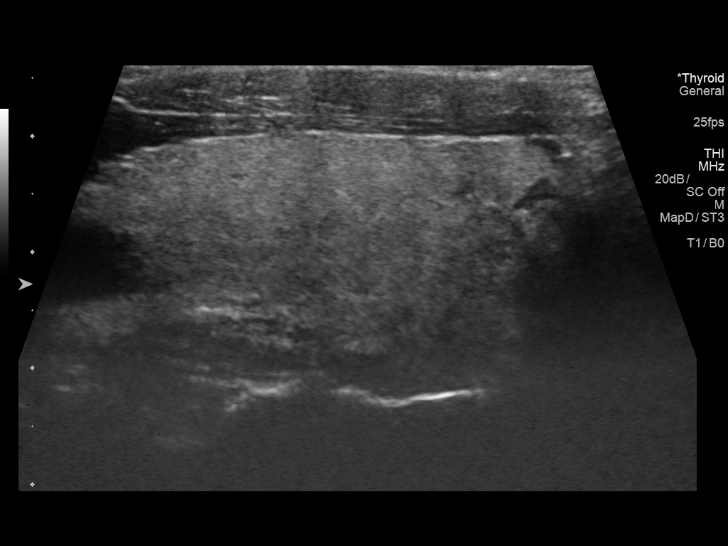
[im 21/46]
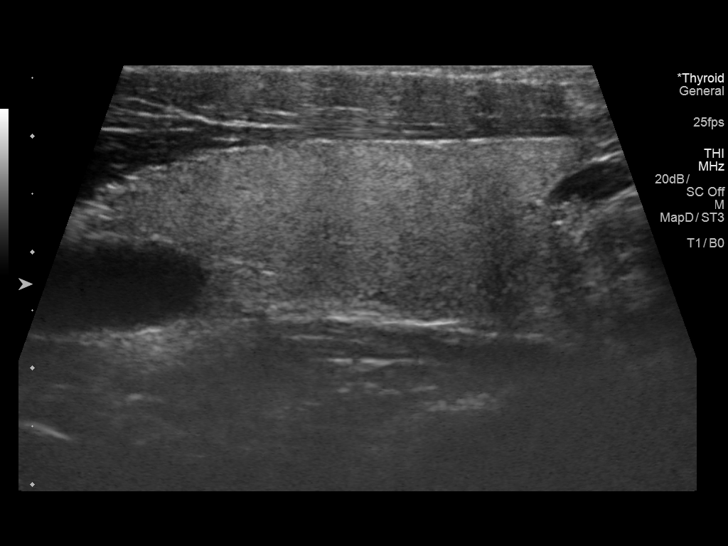
[im 25/46]
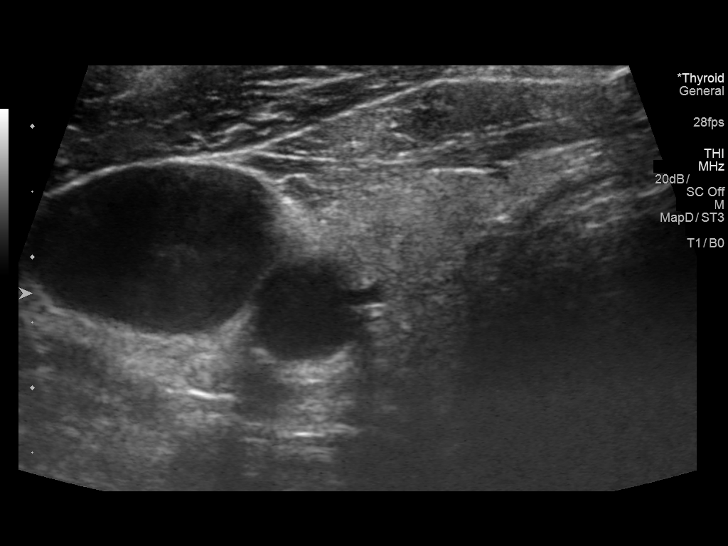
[im 29/46]
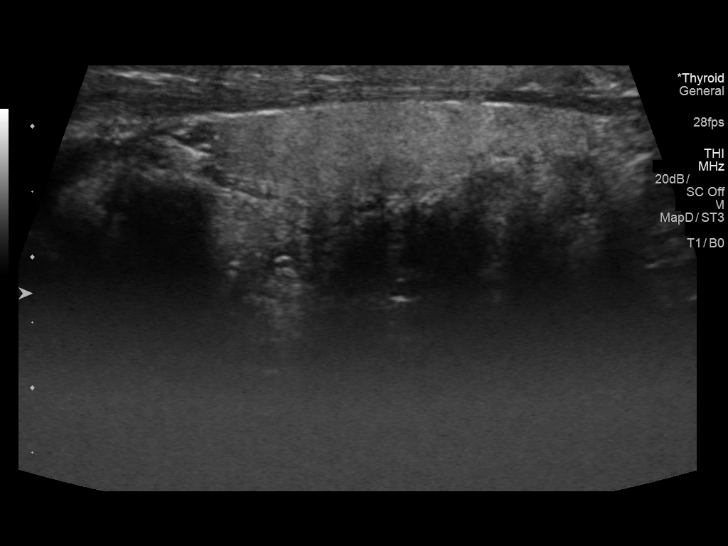
[im 31/46]
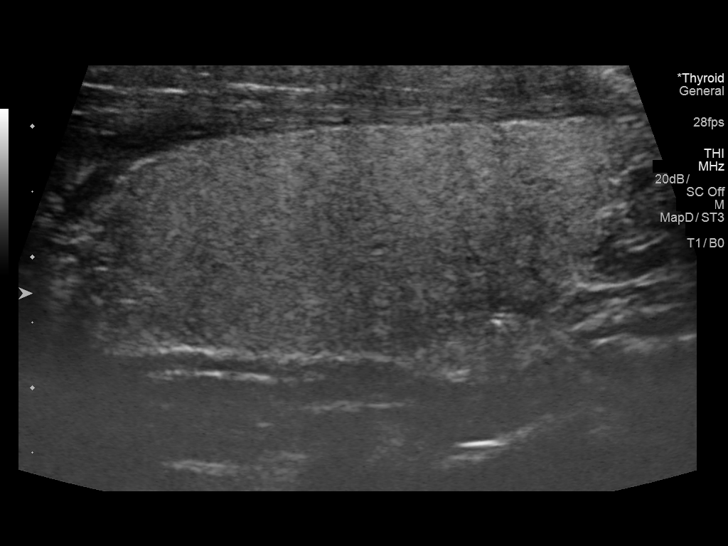
[im 34/46]
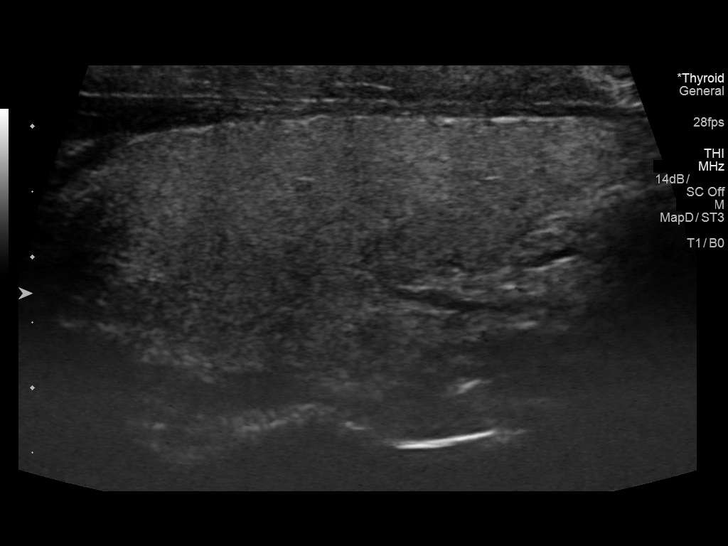
[im 38/46]
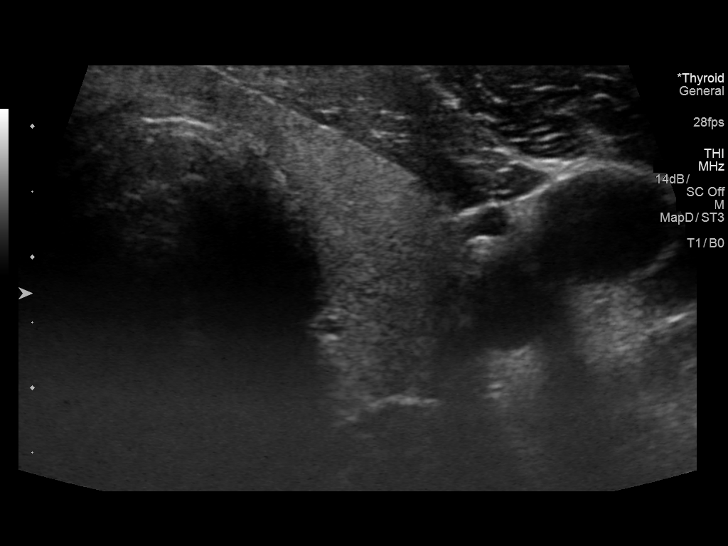
[im 42/46]
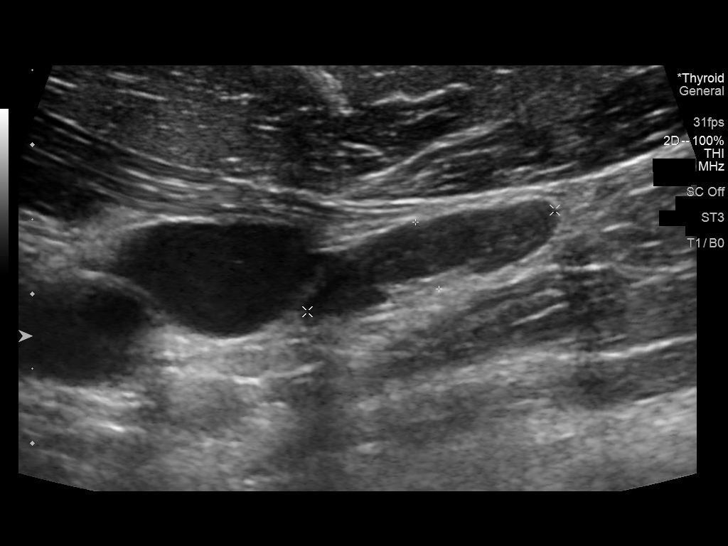
[im 46/46]
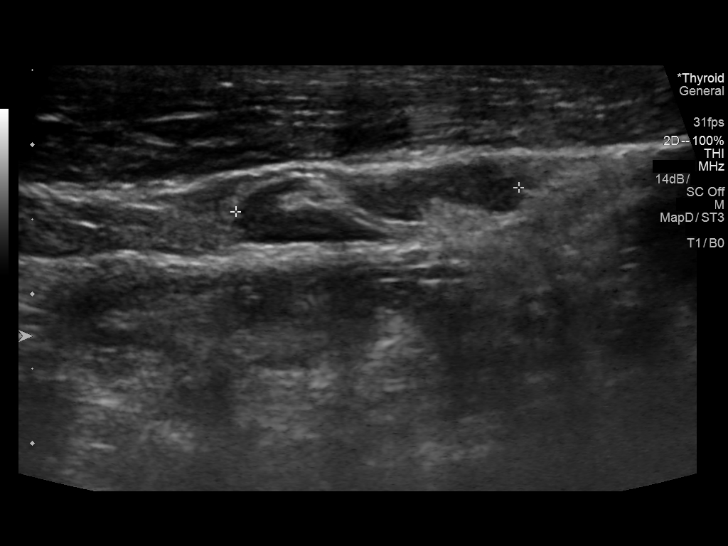

[14 of 25 positions shown; findings below may reference images not displayed]

FINDINGS: Parenchymal Echotexture: Normal

Isthmus: 0.3 cm

Right lobe: 3.8 cm x 1.8 cm x 1.7 cm

Left lobe: 4.2 cm x 1.9 cm x 1.3 cm

_________________________________________________________

Estimated total number of nodules >/= 1 cm: 0

Number of spongiform nodules >/=  2 cm not described below (TR1): 0

Number of mixed cystic and solid nodules >/= 1.5 cm not described
below (TR2): 0

_________________________________________________________

No discrete nodules are seen within the thyroid gland. Typical
appearing lymph nodes.
IMPRESSION: Unremarkable sonographic survey of the thyroid.

## 2019-05-25 ENCOUNTER — Telehealth: Payer: Self-pay | Admitting: Internal Medicine

## 2019-05-25 MED ORDER — LEVETIRACETAM 500 MG PO TABS: 500.0000 mg | ORAL_TABLET | Freq: Two times a day (BID) | ORAL | 1 refills | Status: DC

## 2019-05-25 MED ORDER — HYDROCHLOROTHIAZIDE 25 MG PO TABS: 25.0000 mg | ORAL_TABLET | Freq: Every day | ORAL | 1 refills | Status: DC

## 2019-05-25 NOTE — Telephone Encounter (Signed)
Rx sent 

## 2019-05-25 NOTE — Telephone Encounter (Signed)
° ° ° ° ° ° ° °  1. Which medications need to be refilled? (please list name of each medication and dose if known) hydrochlorothiazide (HYDRODIURIL) 25 MG tablet levETIRAcetam (KEPPRA) 500 MG tablet  2. Which pharmacy/location (including street and city if local pharmacy) is medication to be sent to? [facility]  3. Do they need a 30 day or 90 day supply? [place]

## 2019-05-28 ENCOUNTER — Other Ambulatory Visit: Payer: Self-pay | Admitting: *Deleted

## 2019-05-31 ENCOUNTER — Telehealth: Payer: Self-pay | Admitting: Internal Medicine

## 2019-05-31 MED ORDER — HYDROCHLOROTHIAZIDE 25 MG PO TABS: 25.0000 mg | ORAL_TABLET | Freq: Every day | ORAL | 1 refills | Status: DC

## 2019-05-31 MED ORDER — LEVETIRACETAM 500 MG PO TABS: 500.0000 mg | ORAL_TABLET | Freq: Two times a day (BID) | ORAL | 1 refills | Status: DC

## 2019-05-31 NOTE — Telephone Encounter (Signed)
Rx refilled.

## 2019-05-31 NOTE — Telephone Encounter (Signed)
Patient states the pharmacy is requesting prior auth for these medications hydrochlorothiazide (HYDRODIURIL) 25 MG tablet levETIRAcetam (KEPPRA) 500 MG tablet

## 2019-05-31 NOTE — Telephone Encounter (Signed)
LVM for pt to call back in regards.  

## 2019-11-10 DIAGNOSIS — E8809 Other disorders of plasma-protein metabolism, not elsewhere classified: Secondary | ICD-10-CM | POA: Insufficient documentation

## 2019-11-10 NOTE — Progress Notes (Signed)
Subjective:    Patient ID: James Pitts, male    DOB: [birth year], 44 y.o.   MRN: [id number]  HPI The patient is here for follow up of their chronic medical problems, including htn, prediabetes, seizure d/o  He is taking all of his medications as prescribed.    He is not exercising regularly.     He is not as good with his diet.    Taking BCAA supplement daily.  This is basically an amino acid supplement.   Medications and allergies reviewed with patient and updated if appropriate.  Patient Active Problem List   Diagnosis Date Noted  . Hyperalbuminemia 11/10/2019  . Thyromegaly - [place] 2019 normal 08/18/2017  . Eczema 01/23/2016  . Prediabetes 03/12/2015  . Essential hypertension, benign 03/09/2015  . Seizures ([place]) 03/09/2015  . Obese 03/09/2015    Current Outpatient Medications on File Prior to Visit  Medication Sig Dispense Refill  . hydrochlorothiazide (HYDRODIURIL) 25 MG tablet Take 1 tablet (25 mg total) by mouth daily. 90 tablet 1  . levETIRAcetam (KEPPRA) 500 MG tablet Take 1 tablet (500 mg total) by mouth 2 (two) times daily. 180 tablet 1   No current facility-administered medications on file prior to visit.    Past Medical History:  Diagnosis Date  . Essential hypertension   . GERD (gastroesophageal reflux disease)   . Seizures ([place])     Past Surgical History:  Procedure Laterality Date  . tonsilecomy      Social History   Socioeconomic History  . Marital status: Married    Spouse name: Not on file  . Number of children: 4  . Years of education: Not on file  . Highest education level: Not on file  Occupational History  . Not on file  Tobacco Use  . Smoking status: Never Smoker  . Smokeless tobacco: Never Used  Substance and Sexual Activity  . Alcohol use: Yes    Alcohol/week: 0.0 standard drinks    Comment: rare  . Drug use: No  . Sexual activity: Not on file  Other Topics Concern  . Not on file  Social History Narrative    Occupational therapist   Social Determinants of Health   Financial Resource Strain:   . Difficulty of Paying Living Expenses:   Food Insecurity:   . Worried About Charity fundraiser in the Last Year:   . Arboriculturist in the Last Year:   Transportation Needs:   . Film/video editor (Medical):   Marland Kitchen Lack of Transportation (Non-Medical):   Physical Activity:   . Days of Exercise per Week:   . Minutes of Exercise per Session:   Stress:   . Feeling of Stress :   Social Connections:   . Frequency of Communication with Friends and Family:   . Frequency of Social Gatherings with Friends and Family:   . Attends Religious Services:   . Active Member of Clubs or Organizations:   . Attends Archivist Meetings:   Marland Kitchen Marital Status:     Family History  Problem Relation Age of Onset  . Hypertension Mother   . Multiple sclerosis Mother   . Hypertension Father   . Asthma Father     Review of Systems  Constitutional: Negative for chills and fever.  Respiratory: Negative for cough, shortness of breath and wheezing.   Cardiovascular: Negative for chest pain, palpitations and leg swelling.  Neurological: Negative for light-headedness and headaches.       Objective:  Vitals:   11/11/19 0815  BP: 124/90  Pulse: 85  Temp: 98.3 F (36.8 C)  SpO2: 97%   BP Readings from Last 3 Encounters:  11/11/19 124/90  11/11/18 (!) 144/98  02/18/18 126/88   Wt Readings from Last 3 Encounters:  11/11/19 286 lb 9.6 oz (130 kg)  11/11/18 283 lb (128.4 kg)  02/18/18 284 lb (128.8 kg)   Body mass index is 35.82 kg/m.   Physical Exam    Constitutional: Appears well-developed and well-nourished. No distress.  HENT:  Head: Normocephalic and atraumatic.  Neck: Neck supple. No tracheal deviation present. No thyromegaly present.  No cervical lymphadenopathy Cardiovascular: Normal rate, regular rhythm and normal heart sounds.   No murmur heard. No carotid bruit .  No  edema Pulmonary/Chest: Effort normal and breath sounds normal. No respiratory distress. No has no wheezes. No rales.  Skin: Skin is warm and dry. Not diaphoretic.  Psychiatric: Normal mood and affect. Behavior is normal.      Assessment & Plan:    See Problem List for Assessment and Plan of chronic medical problems.    This visit occurred during the SARS-CoV-2 public health emergency.  Safety protocols were in place, including screening questions prior to the visit, additional usage of staff PPE, and extensive cleaning of exam room while observing appropriate contact time as indicated for disinfecting solutions.

## 2019-11-10 NOTE — Patient Instructions (Addendum)
  Blood work was ordered.   ° ° °Medications reviewed and updated.  Changes include :   none ° ° ° °Please followup in 6 months ° ° °

## 2019-11-11 ENCOUNTER — Other Ambulatory Visit: Payer: Self-pay

## 2019-11-11 ENCOUNTER — Ambulatory Visit (INDEPENDENT_AMBULATORY_CARE_PROVIDER_SITE_OTHER): Payer: No Typology Code available for payment source | Admitting: Internal Medicine

## 2019-11-11 ENCOUNTER — Encounter: Payer: Self-pay | Admitting: Internal Medicine

## 2019-11-11 VITALS — BP 124/90 | HR 85 | Temp 98.3°F | Wt 286.6 lb

## 2019-11-11 DIAGNOSIS — R779 Abnormality of plasma protein, unspecified: Secondary | ICD-10-CM | POA: Insufficient documentation

## 2019-11-11 DIAGNOSIS — Z1159 Encounter for screening for other viral diseases: Secondary | ICD-10-CM

## 2019-11-11 DIAGNOSIS — I10 Essential (primary) hypertension: Secondary | ICD-10-CM

## 2019-11-11 DIAGNOSIS — R569 Unspecified convulsions: Secondary | ICD-10-CM

## 2019-11-11 DIAGNOSIS — R7303 Prediabetes: Secondary | ICD-10-CM

## 2019-11-11 DIAGNOSIS — Z125 Encounter for screening for malignant neoplasm of prostate: Secondary | ICD-10-CM | POA: Insufficient documentation

## 2019-11-11 MED ORDER — LEVETIRACETAM 500 MG PO TABS: 500.0000 mg | ORAL_TABLET | Freq: Two times a day (BID) | ORAL | 1 refills | Status: DC

## 2019-11-11 MED ORDER — HYDROCHLOROTHIAZIDE 25 MG PO TABS: 25.0000 mg | ORAL_TABLET | Freq: Every day | ORAL | 1 refills | Status: DC

## 2019-11-11 NOTE — Assessment & Plan Note (Signed)
Father was diagnosed with prostate cancer We will check PSA

## 2019-11-11 NOTE — Assessment & Plan Note (Signed)
Chronic No seizure-like activity Continue Keppra at current dose

## 2019-11-11 NOTE — Assessment & Plan Note (Signed)
Chronic BP well controlled-diastolic slightly elevated, but still think he has good control Current regimen effective and well tolerated Continue current medications at current doses Resume regular exercise and work on weight loss cmp

## 2019-11-11 NOTE — Assessment & Plan Note (Signed)
His protein level has been elevated ?  Related to BCA supplement-amino acid supplement He will discontinue the above supplement for 2 weeks approximately and then get blood work checked to see if it still elevated If it is still elevated may need SPEP, UPEP

## 2019-11-11 NOTE — Assessment & Plan Note (Signed)
Chronic Check a1c Low sugar / carb diet Stressed regular exercise  

## 2019-12-03 ENCOUNTER — Other Ambulatory Visit (INDEPENDENT_AMBULATORY_CARE_PROVIDER_SITE_OTHER): Payer: No Typology Code available for payment source

## 2019-12-03 DIAGNOSIS — R7303 Prediabetes: Secondary | ICD-10-CM | POA: Diagnosis not present

## 2019-12-03 DIAGNOSIS — R779 Abnormality of plasma protein, unspecified: Secondary | ICD-10-CM

## 2019-12-03 DIAGNOSIS — I10 Essential (primary) hypertension: Secondary | ICD-10-CM | POA: Diagnosis not present

## 2019-12-03 DIAGNOSIS — Z1159 Encounter for screening for other viral diseases: Secondary | ICD-10-CM

## 2019-12-03 DIAGNOSIS — Z125 Encounter for screening for malignant neoplasm of prostate: Secondary | ICD-10-CM

## 2019-12-03 LAB — COMPREHENSIVE METABOLIC PANEL
ALT: 32 U/L (ref 0–53)
AST: 23 U/L (ref 0–37)
Albumin: 4.3 g/dL (ref 3.5–5.2)
Alkaline Phosphatase: 99 U/L (ref 39–117)
BUN: 15 mg/dL (ref 6–23)
CO2: 27 mEq/L (ref 19–32)
Calcium: 9.5 mg/dL (ref 8.4–10.5)
Chloride: 98 mEq/L (ref 96–112)
Creatinine, Ser: 1.28 mg/dL (ref 0.40–1.50)
GFR: 74.1 mL/min (ref >60.00)
Glucose, Bld: 103 mg/dL — ABNORMAL HIGH (ref 70–99)
Potassium: 4.4 mEq/L (ref 3.5–5.1)
Sodium: 135 mEq/L (ref 135–145)
Total Bilirubin: 0.5 mg/dL (ref 0.2–1.2)
Total Protein: 8.6 g/dL — ABNORMAL HIGH (ref 6.0–8.3)

## 2019-12-03 LAB — HEMOGLOBIN A1C: Hgb A1c MFr Bld: 5.8 % (ref 4.6–6.5)

## 2019-12-03 NOTE — Addendum Note (Signed)
Addended by: Jacob Moores on: 12/03/2019 08:41 AM   Modules accepted: Orders

## 2019-12-06 LAB — PSA, TOTAL AND FREE
PSA, % Free: 38 % (calc) (ref >25)
PSA, Free: 0.3 ng/mL
PSA, Total: 0.8 ng/mL (ref <=4.0)

## 2019-12-06 LAB — HEPATITIS C ANTIBODY
Hepatitis C Ab: NONREACTIVE
SIGNAL TO CUT-OFF: 0.02 (ref <1.00)

## 2020-03-16 ENCOUNTER — Telehealth: Payer: Self-pay | Admitting: Internal Medicine

## 2020-03-16 NOTE — Telephone Encounter (Signed)
James Pitts dropped off [employer] Release forms for both herself and James Pitts.   Please call Dequante Tremaine @ [phone] when forms are complete and ready for pick-up  Forms placed in providers box

## 2020-03-17 NOTE — Telephone Encounter (Signed)
Forms completed.  Spoke with Ms.Compere today. She will pick up this afternoon.  Forms taken up front and left under patient section for pick up.

## 2020-05-13 NOTE — Progress Notes (Signed)
Subjective:    Patient ID: James Pitts, male    DOB: 1975-05-14, 45 y.o.   MRN: [id number]  HPI He is here for a physical exam.   He has no questions or concerns. He denies changes.   Medications and allergies reviewed with patient and updated if appropriate.  Patient Active Problem List   Diagnosis Date Noted  . High serum protein level 11/11/2019  . Encounter for prostate cancer screening 11/11/2019  . Thyromegaly - [place] 2019 normal 08/18/2017  . Eczema 01/23/2016  . Prediabetes 03/12/2015  . Essential hypertension, benign 03/09/2015  . Seizures ([place]) 03/09/2015  . Obese 03/09/2015    Current Outpatient Medications on File Prior to Visit  Medication Sig Dispense Refill  . hydrochlorothiazide (HYDRODIURIL) 25 MG tablet Take 1 tablet (25 mg total) by mouth daily. 90 tablet 1  . levETIRAcetam (KEPPRA) 500 MG tablet Take 1 tablet (500 mg total) by mouth 2 (two) times daily. 180 tablet 1   No current facility-administered medications on file prior to visit.    Past Medical History:  Diagnosis Date  . Essential hypertension   . GERD (gastroesophageal reflux disease)   . Seizures ([place])     Past Surgical History:  Procedure Laterality Date  . tonsilecomy      Social History   Socioeconomic History  . Marital status: Married    Spouse name: Not on file  . Number of children: 4  . Years of education: Not on file  . Highest education level: Not on file  Occupational History  . Not on file  Tobacco Use  . Smoking status: Never Smoker  . Smokeless tobacco: Never Used  Substance and Sexual Activity  . Alcohol use: Yes    Alcohol/week: 0.0 standard drinks    Comment: rare  . Drug use: No  . Sexual activity: Not on file  Other Topics Concern  . Not on file  Social History Narrative   Occupational therapist   Social Determinants of Health   Financial Resource Strain: Not on file  Food Insecurity: Not on file  Transportation Needs: Not on file  Physical  Activity: Not on file  Stress: Not on file  Social Connections: Not on file    Family History  Problem Relation Age of Onset  . Hypertension Mother   . Multiple sclerosis Mother   . Hypertension Father   . Asthma Father   . Prostate cancer Father 24    Review of Systems  Constitutional: Negative for chills and fever.  Eyes: Negative for visual disturbance.  Respiratory: Negative for cough, shortness of breath and wheezing.   Cardiovascular: Negative for chest pain, palpitations and leg swelling.  Gastrointestinal: Negative for abdominal pain, blood in stool, constipation, diarrhea and nausea.       Rare gerd  Genitourinary: Negative for difficulty urinating, dysuria and hematuria.  Musculoskeletal: Positive for back pain (chronic). Negative for arthralgias.  Skin: Positive for rash (eczema flares).  Neurological: Negative for dizziness, seizures, light-headedness and headaches.  Psychiatric/Behavioral: Negative for dysphoric mood. The patient is not nervous/anxious.        Objective:   Vitals:   05/15/20 0845  BP: 140/82  Pulse: 79  Temp: 98.5 F (36.9 C)  SpO2: 99%   Filed Weights   05/15/20 0845  Weight: 290 lb (131.5 kg)   Body mass index is 36.25 kg/m.  BP Readings from Last 3 Encounters:  05/15/20 140/82  11/11/19 124/90  11/11/18 (!) 144/98    Wt Readings  from Last 3 Encounters:  05/15/20 290 lb (131.5 kg)  11/11/19 286 lb 9.6 oz (130 kg)  11/11/18 283 lb (128.4 kg)     Physical Exam Constitutional: He appears well-developed and well-nourished. No distress.  HENT:  Head: Normocephalic and atraumatic.  Right Ear: External ear normal.  Left Ear: External ear normal.  Mouth/Throat: Oropharynx is clear and moist.  Normal ear canals and TM b/l  Eyes: Conjunctivae and EOM are normal.  Neck: Neck supple. No tracheal deviation present. No thyromegaly present.  No carotid bruit  Cardiovascular: Normal rate, regular rhythm, normal heart sounds and  intact distal pulses.   No murmur heard. Pulmonary/Chest: Effort normal and breath sounds normal. No respiratory distress. He has no wheezes. He has no rales.  Abdominal: Soft. He exhibits no distension. There is no tenderness.  Genitourinary: deferred  Musculoskeletal: He exhibits no edema.  Lymphadenopathy:   He has no cervical adenopathy.  Skin: Skin is warm and dry. He is not diaphoretic.  Psychiatric: He has a normal mood and affect. His behavior is normal.         Assessment & Plan:   Physical exam: Screening blood work  ordered Immunizations  Had flu via work, others up to date   Exercise   None - Just joined gym - will start again Weight  Encouraged weight loss Substance abuse   none  See Problem List for Assessment and Plan of chronic medical problems.   This visit occurred during the SARS-CoV-2 public health emergency.  Safety protocols were in place, including screening questions prior to the visit, additional usage of staff PPE, and extensive cleaning of exam room while observing appropriate contact time as indicated for disinfecting solutions.

## 2020-05-13 NOTE — Patient Instructions (Signed)
Blood work was ordered.     Medications changes include : none    Your prescription(s) have been submitted to your pharmacy. Please take as directed and contact our office if you believe you are having problem(s) with the medication(s).    Please followup in 6 months    Health Maintenance, Male Adopting a healthy lifestyle and getting preventive care are important in promoting health and wellness. Ask your health care provider about:  The right schedule for you to have regular tests and exams.  Things you can do on your own to prevent diseases and keep yourself healthy. What should I know about diet, weight, and exercise? Eat a healthy diet  Eat a diet that includes plenty of vegetables, fruits, low-fat dairy products, and lean protein.  Do not eat a lot of foods that are high in solid fats, added sugars, or sodium.   Maintain a healthy weight Body mass index (BMI) is a measurement that can be used to identify possible weight problems. It estimates body fat based on height and weight. Your health care provider can help determine your BMI and help you achieve or maintain a healthy weight. Get regular exercise Get regular exercise. This is one of the most important things you can do for your health. Most adults should:  Exercise for at least 150 minutes each week. The exercise should increase your heart rate and make you sweat (moderate-intensity exercise).  Do strengthening exercises at least twice a week. This is in addition to the moderate-intensity exercise.  Spend less time sitting. Even light physical activity can be beneficial. Watch cholesterol and blood lipids Have your blood tested for lipids and cholesterol at 44 years of age, then have this test every 5 years. You may need to have your cholesterol levels checked more often if:  Your lipid or cholesterol levels are high.  You are older than 44 years of age.  You are at high risk for heart disease. What should I  know about cancer screening? Many types of cancers can be detected early and may often be prevented. Depending on your health history and family history, you may need to have cancer screening at various ages. This may include screening for:  Colorectal cancer.  Prostate cancer.  Skin cancer.  Lung cancer. What should I know about heart disease, diabetes, and high blood pressure? Blood pressure and heart disease  High blood pressure causes heart disease and increases the risk of stroke. This is more likely to develop in people who have high blood pressure readings, are of African descent, or are overweight.  Talk with your health care provider about your target blood pressure readings.  Have your blood pressure checked: ? Every 3-5 years if you are 18-39 years of age. ? Every year if you are 40 years old or older.  If you are between the ages of 65 and 75 and are a current or former smoker, ask your health care provider if you should have a one-time screening for abdominal aortic aneurysm (AAA). Diabetes Have regular diabetes screenings. This checks your fasting blood sugar level. Have the screening done:  Once every three years after age 45 if you are at a normal weight and have a low risk for diabetes.  More often and at a younger age if you are overweight or have a high risk for diabetes. What should I know about preventing infection? Hepatitis B If you have a higher risk for hepatitis B, you should be screened for   this virus. Talk with your health care provider to find out if you are at risk for hepatitis B infection. Hepatitis C Blood testing is recommended for:  Everyone born from 1945 through 1965.  Anyone with known risk factors for hepatitis C. Sexually transmitted infections (STIs)  You should be screened each year for STIs, including gonorrhea and chlamydia, if: ? You are sexually active and are younger than 44 years of age. ? You are older than 44 years of age and  your health care provider tells you that you are at risk for this type of infection. ? Your sexual activity has changed since you were last screened, and you are at increased risk for chlamydia or gonorrhea. Ask your health care provider if you are at risk.  Ask your health care provider about whether you are at high risk for HIV. Your health care provider may recommend a prescription medicine to help prevent HIV infection. If you choose to take medicine to prevent HIV, you should first get tested for HIV. You should then be tested every 3 months for as long as you are taking the medicine. Follow these instructions at home: Lifestyle  Do not use any products that contain nicotine or tobacco, such as cigarettes, e-cigarettes, and chewing tobacco. If you need help quitting, ask your health care provider.  Do not use street drugs.  Do not share needles.  Ask your health care provider for help if you need support or information about quitting drugs. Alcohol use  Do not drink alcohol if your health care provider tells you not to drink.  If you drink alcohol: ? Limit how much you have to 0-2 drinks a day. ? Be aware of how much alcohol is in your drink. In the [place] one drink equals one 12 oz bottle of beer (355 mL), one 5 oz glass of wine (148 mL), or one 1 oz glass of hard liquor (44 mL). General instructions  Schedule regular health, dental, and eye exams.  Stay current with your vaccines.  Tell your health care provider if: ? You often feel depressed. ? You have ever been abused or do not feel safe at home. Summary  Adopting a healthy lifestyle and getting preventive care are important in promoting health and wellness.  Follow your health care provider's instructions about healthy diet, exercising, and getting tested or screened for diseases.  Follow your health care provider's instructions on monitoring your cholesterol and blood pressure. This information is not intended to  replace advice given to you by your health care provider. Make sure you discuss any questions you have with your health care provider. Document Revised: 03/18/2018 Document Reviewed: 03/18/2018 Elsevier Patient Education  2021 [employer] Inc.  

## 2020-05-15 ENCOUNTER — Ambulatory Visit (INDEPENDENT_AMBULATORY_CARE_PROVIDER_SITE_OTHER): Payer: No Typology Code available for payment source | Admitting: Internal Medicine

## 2020-05-15 ENCOUNTER — Other Ambulatory Visit: Payer: Self-pay

## 2020-05-15 ENCOUNTER — Encounter: Payer: Self-pay | Admitting: Internal Medicine

## 2020-05-15 VITALS — BP 140/82 | HR 79 | Temp 98.5°F | Ht 75.0 in | Wt 290.0 lb

## 2020-05-15 DIAGNOSIS — R569 Unspecified convulsions: Secondary | ICD-10-CM | POA: Diagnosis not present

## 2020-05-15 DIAGNOSIS — R7303 Prediabetes: Secondary | ICD-10-CM

## 2020-05-15 DIAGNOSIS — Z0000 Encounter for general adult medical examination without abnormal findings: Secondary | ICD-10-CM

## 2020-05-15 DIAGNOSIS — I10 Essential (primary) hypertension: Secondary | ICD-10-CM

## 2020-05-15 DIAGNOSIS — Z6836 Body mass index (BMI) 36.0-36.9, adult: Secondary | ICD-10-CM | POA: Insufficient documentation

## 2020-05-15 LAB — CBC WITH DIFFERENTIAL/PLATELET
Basophils Absolute: 0 10*3/uL (ref 0.0–0.1)
Basophils Relative: 0.8 % (ref 0.0–3.0)
Eosinophils Absolute: 0.2 10*3/uL (ref 0.0–0.7)
Eosinophils Relative: 3.3 % (ref 0.0–5.0)
HCT: 37.3 % — ABNORMAL LOW (ref 39.0–52.0)
Hemoglobin: 12.7 g/dL — ABNORMAL LOW (ref 13.0–17.0)
Lymphocytes Relative: 26.6 % (ref 12.0–46.0)
Lymphs Abs: 1.5 10*3/uL (ref 0.7–4.0)
MCHC: 34.2 g/dL (ref 30.0–36.0)
MCV: 94.5 fl (ref 78.0–100.0)
Monocytes Absolute: 0.6 10*3/uL (ref 0.1–1.0)
Monocytes Relative: 11 % (ref 3.0–12.0)
Neutro Abs: 3.3 10*3/uL (ref 1.4–7.7)
Neutrophils Relative %: 58.3 % (ref 43.0–77.0)
Platelets: 422 10*3/uL — ABNORMAL HIGH (ref 150.0–400.0)
RBC: 3.94 Mil/uL — ABNORMAL LOW (ref 4.22–5.81)
RDW: 13.1 % (ref 11.5–15.5)
WBC: 5.7 10*3/uL (ref 4.0–10.5)

## 2020-05-15 LAB — COMPREHENSIVE METABOLIC PANEL
ALT: 28 U/L (ref 0–53)
AST: 24 U/L (ref 0–37)
Albumin: 4.2 g/dL (ref 3.5–5.2)
Alkaline Phosphatase: 96 U/L (ref 39–117)
BUN: 24 mg/dL — ABNORMAL HIGH (ref 6–23)
CO2: 31 mEq/L (ref 19–32)
Calcium: 9.6 mg/dL (ref 8.4–10.5)
Chloride: 100 mEq/L (ref 96–112)
Creatinine, Ser: 1.32 mg/dL (ref 0.40–1.50)
GFR: 65.94 mL/min (ref >60.00)
Glucose, Bld: 97 mg/dL (ref 70–99)
Potassium: 4.4 mEq/L (ref 3.5–5.1)
Sodium: 136 mEq/L (ref 135–145)
Total Bilirubin: 0.3 mg/dL (ref 0.2–1.2)
Total Protein: 8.4 g/dL — ABNORMAL HIGH (ref 6.0–8.3)

## 2020-05-15 LAB — LIPID PANEL
Cholesterol: 152 mg/dL (ref 0–200)
HDL: 52 mg/dL (ref >39.00)
LDL Cholesterol: 86 mg/dL (ref 0–99)
NonHDL: 99.78
Total CHOL/HDL Ratio: 3
Triglycerides: 68 mg/dL (ref 0.0–149.0)
VLDL: 13.6 mg/dL (ref 0.0–40.0)

## 2020-05-15 LAB — HEMOGLOBIN A1C: Hgb A1c MFr Bld: 5.9 % (ref 4.6–6.5)

## 2020-05-15 LAB — TSH: TSH: 2.59 u[IU]/mL (ref 0.35–4.50)

## 2020-05-15 MED ORDER — LEVETIRACETAM 500 MG PO TABS: 500.0000 mg | ORAL_TABLET | Freq: Two times a day (BID) | ORAL | 1 refills | Status: DC

## 2020-05-15 MED ORDER — MELATONIN 10 MG PO CAPS: ORAL_CAPSULE | ORAL | Status: AC

## 2020-05-15 MED ORDER — HYDROCHLOROTHIAZIDE 25 MG PO TABS: 25.0000 mg | ORAL_TABLET | Freq: Every day | ORAL | 1 refills | Status: DC

## 2020-05-15 NOTE — Assessment & Plan Note (Signed)
Chronic Controlled, stable - no seizure like activity Continue keppra 500 mg BID

## 2020-05-15 NOTE — Assessment & Plan Note (Addendum)
Chronic BMI 36.25 with htn and prediabetes Currently not exercising - but just joined a gym and will start exercising Stressed healthy diet, smaller portions He plans on working on weight loss F/u in 6 months

## 2020-05-15 NOTE — Assessment & Plan Note (Signed)
Chronic Check a1c Low sugar / carb diet Stressed regular exercise Will work on weight loss  

## 2020-05-15 NOTE — Assessment & Plan Note (Signed)
Chronic BP not ideally controlled - stressed to him that his BP needs to be less than 130/80 Has plans to start exercising and will work on weight loss Continue hctz 25 mg daily Discussed if BP is still elevated at his next visit he will need additional medication cmp

## 2020-11-05 ENCOUNTER — Other Ambulatory Visit: Payer: Self-pay | Admitting: Internal Medicine

## 2020-11-12 NOTE — Progress Notes (Signed)
Subjective:    Patient ID: James Pitts, male    DOB: 17-Jun-1975, 45 y.o.   MRN: [id number]  HPI The patient is here for follow up of their chronic medical problems, including htn, prediabetes, seizure d/o   BP this morning 124/84 at home.    Bump at lateral anterior wrist.  No known trauma.  Non painful at rest. Occasional pain with certain activities. .      Medications and allergies reviewed with patient and updated if appropriate.  Patient Active Problem List   Diagnosis Date Noted   BMI 36.0-36.9,adult 05/15/2020   High serum protein level 11/11/2019   Encounter for prostate cancer screening 11/11/2019   Thyromegaly - [place] 2019 normal 08/18/2017   Eczema 01/23/2016   Prediabetes 03/12/2015   Essential hypertension, benign 03/09/2015   Seizures ([place]) 03/09/2015   Morbid obesity ([place]) 03/09/2015    Current Outpatient Medications on File Prior to Visit  Medication Sig Dispense Refill   hydrochlorothiazide (HYDRODIURIL) 25 MG tablet TAKE 1 TABLET (25 MG TOTAL) BY MOUTH DAILY. 30 tablet 5   levETIRAcetam (KEPPRA) 500 MG tablet Take 1 tablet (500 mg total) by mouth 2 (two) times daily. 180 tablet 1   Melatonin 10 MG CAPS Take 1 cap nightly 30 capsule    No current facility-administered medications on file prior to visit.    Past Medical History:  Diagnosis Date   Essential hypertension    GERD (gastroesophageal reflux disease)    Seizures (HCC)     Past Surgical History:  Procedure Laterality Date   tonsilecomy      Social History   Socioeconomic History   Marital status: Married    Spouse name: Not on file   Number of children: 4   Years of education: Not on file   Highest education level: Not on file  Occupational History   Not on file  Tobacco Use   Smoking status: Never   Smokeless tobacco: Never  Substance and Sexual Activity   Alcohol use: Yes    Alcohol/week: 0.0 standard drinks    Comment: rare   Drug use: No   Sexual activity: Not on file   Other Topics Concern   Not on file  Social History Narrative   Occupational therapist   Social Determinants of Health   Financial Resource Strain: Not on file  Food Insecurity: Not on file  Transportation Needs: Not on file  Physical Activity: Not on file  Stress: Not on file  Social Connections: Not on file    Family History  Problem Relation Age of Onset   Hypertension Mother    Multiple sclerosis Mother    Hypertension Father    Asthma Father    Prostate cancer Father 41    Review of Systems  Constitutional:  Negative for chills and fever.  Respiratory:  Negative for cough, shortness of breath and wheezing.   Cardiovascular:  Negative for chest pain, palpitations and leg swelling.  Neurological:  Negative for light-headedness and headaches.      Objective:   Vitals:   11/13/20 0825  BP: 136/88  Pulse: 92  Temp: 98 F (36.7 C)  SpO2: 98%   BP Readings from Last 3 Encounters:  11/13/20 136/88  05/15/20 140/82  11/11/19 124/90   Wt Readings from Last 3 Encounters:  11/13/20 288 lb 12.8 oz (131 kg)  05/15/20 290 lb (131.5 kg)  11/11/19 286 lb 9.6 oz (130 kg)   Body mass index is 36.1 kg/m.  Physical Exam    Constitutional: Appears well-developed and well-nourished. No distress.  HENT:  Head: Normocephalic and atraumatic.  Neck: Neck supple. No tracheal deviation present. No thyromegaly present.  No cervical lymphadenopathy Cardiovascular: Normal rate, regular rhythm and normal heart sounds.   No murmur heard. No carotid bruit .  No edema Pulmonary/Chest: Effort normal and breath sounds normal. No respiratory distress. No has no wheezes. No rales.  Msk:  ganglion cyst left lateral anterior wrist Skin: Skin is warm and dry. Not diaphoretic.  Psychiatric: Normal mood and affect. Behavior is normal.      Assessment & Plan:    See Problem List for Assessment and Plan of chronic medical problems.    This visit occurred during the SARS-CoV-2  public health emergency.  Safety protocols were in place, including screening questions prior to the visit, additional usage of staff PPE, and extensive cleaning of exam room while observing appropriate contact time as indicated for disinfecting solutions.

## 2020-11-12 NOTE — Patient Instructions (Addendum)
    Blood work was ordered.     Medications changes include :  none   Your prescription(s) have been submitted to your pharmacy. Please take as directed and contact our office if you believe you are having problem(s) with the medication(s).   Please followup in 6 months  

## 2020-11-13 ENCOUNTER — Ambulatory Visit: Payer: No Typology Code available for payment source | Admitting: Internal Medicine

## 2020-11-13 ENCOUNTER — Encounter: Payer: Self-pay | Admitting: Internal Medicine

## 2020-11-13 ENCOUNTER — Other Ambulatory Visit: Payer: No Typology Code available for payment source

## 2020-11-13 ENCOUNTER — Other Ambulatory Visit: Payer: Self-pay

## 2020-11-13 VITALS — BP 136/88 | HR 92 | Temp 98.0°F | Ht 75.0 in | Wt 288.8 lb

## 2020-11-13 DIAGNOSIS — I10 Essential (primary) hypertension: Secondary | ICD-10-CM

## 2020-11-13 DIAGNOSIS — R569 Unspecified convulsions: Secondary | ICD-10-CM | POA: Diagnosis not present

## 2020-11-13 DIAGNOSIS — R7303 Prediabetes: Secondary | ICD-10-CM | POA: Diagnosis not present

## 2020-11-13 DIAGNOSIS — D649 Anemia, unspecified: Secondary | ICD-10-CM

## 2020-11-13 DIAGNOSIS — M67432 Ganglion, left wrist: Secondary | ICD-10-CM | POA: Insufficient documentation

## 2020-11-13 LAB — CBC WITH DIFFERENTIAL/PLATELET
Absolute Monocytes: 643 cells/uL (ref 200–950)
Basophils Absolute: 22 cells/uL (ref 0–200)
Basophils Relative: 0.4 %
Eosinophils Absolute: 189 cells/uL (ref 15–500)
Eosinophils Relative: 3.5 %
HCT: 37.4 % — ABNORMAL LOW (ref 38.5–50.0)
Hemoglobin: 12.4 g/dL — ABNORMAL LOW (ref 13.2–17.1)
Lymphs Abs: 1561 cells/uL (ref 850–3900)
MCH: 30 pg (ref 27.0–33.0)
MCHC: 33.2 g/dL (ref 32.0–36.0)
MCV: 90.3 fL (ref 80.0–100.0)
MPV: 9.2 fL (ref 7.5–12.5)
Monocytes Relative: 11.9 %
Neutro Abs: 2986 cells/uL (ref 1500–7800)
Neutrophils Relative %: 55.3 %
Platelets: 372 10*3/uL (ref 140–400)
RBC: 4.14 10*6/uL — ABNORMAL LOW (ref 4.20–5.80)
RDW: 13.5 % (ref 11.0–15.0)
Total Lymphocyte: 28.9 %
WBC: 5.4 10*3/uL (ref 3.8–10.8)

## 2020-11-13 LAB — COMPREHENSIVE METABOLIC PANEL
ALT: 27 U/L (ref 0–53)
AST: 23 U/L (ref 0–37)
Albumin: 4.1 g/dL (ref 3.5–5.2)
Alkaline Phosphatase: 90 U/L (ref 39–117)
BUN: 25 mg/dL — ABNORMAL HIGH (ref 6–23)
CO2: 28 mEq/L (ref 19–32)
Calcium: 9.2 mg/dL (ref 8.4–10.5)
Chloride: 103 mEq/L (ref 96–112)
Creatinine, Ser: 1.37 mg/dL (ref 0.40–1.50)
GFR: 62.84 mL/min (ref >60.00)
Glucose, Bld: 96 mg/dL (ref 70–99)
Potassium: 4.7 mEq/L (ref 3.5–5.1)
Sodium: 138 mEq/L (ref 135–145)
Total Bilirubin: 0.4 mg/dL (ref 0.2–1.2)
Total Protein: 8.3 g/dL (ref 6.0–8.3)

## 2020-11-13 LAB — HEMOGLOBIN A1C: Hgb A1c MFr Bld: 5.7 % (ref 4.6–6.5)

## 2020-11-13 MED ORDER — HYDROCHLOROTHIAZIDE 25 MG PO TABS: 25.0000 mg | ORAL_TABLET | Freq: Every day | ORAL | 1 refills | Status: DC

## 2020-11-13 MED ORDER — LEVETIRACETAM 500 MG PO TABS: 500.0000 mg | ORAL_TABLET | Freq: Two times a day (BID) | ORAL | 1 refills | Status: DC

## 2020-11-13 NOTE — Assessment & Plan Note (Signed)
Acute Reassured-this is benign Advised that when he is ready to have it removed or drained he can let me know and I can refer him

## 2020-11-13 NOTE — Assessment & Plan Note (Signed)
Chronic Controlled - no seizure activity Continue keppra 500 mg bid

## 2020-11-13 NOTE — Addendum Note (Signed)
Addended by: Octavio Manns E on: 11/13/2020 12:10 PM   Modules accepted: Orders

## 2020-11-13 NOTE — Assessment & Plan Note (Addendum)
Chronic BP controlled, but I advised him ideally would like his blood pressure on the lower side Continue hctz 25 mg qd Restart regular exercise and work on weight loss Cmp, cbc

## 2020-11-13 NOTE — Assessment & Plan Note (Signed)
Chronic Check a1c Low sugar / carb diet Stressed regular exercise  

## 2020-11-13 NOTE — Assessment & Plan Note (Signed)
Chronic BMI 36.10 with hypertension,  Prediabetes Stressed regular exercise, weight loss Decrease portions, healthy diet

## 2020-11-14 ENCOUNTER — Encounter: Payer: Self-pay | Admitting: Internal Medicine

## 2020-11-14 NOTE — Addendum Note (Signed)
Addended by: Binnie Rail on: 11/14/2020 12:59 PM   Modules accepted: Orders

## 2021-05-06 ENCOUNTER — Other Ambulatory Visit: Payer: Self-pay | Admitting: Internal Medicine

## 2021-05-13 NOTE — Progress Notes (Signed)
Subjective:    Patient ID: James Pitts, male    DOB: [birth year], 46 y.o.   MRN: [id number]  This visit occurred during the SARS-CoV-2 public health emergency.  Safety protocols were in place, including screening questions prior to the visit, additional usage of staff PPE, and extensive cleaning of exam room while observing appropriate contact time as indicated for disinfecting solutions.     HPI The patient is here for follow up of their chronic medical problems, including htn, prediabetes, seizure d/o  He has been having GERD - he has been taking nexium 40 mg daily and that has helped.    Medications and allergies reviewed with patient and updated if appropriate.  Patient Active Problem List   Diagnosis Date Noted   Ganglion cyst of wrist, left 11/13/2020   BMI 36.0-36.9,adult 05/15/2020   High serum protein level 11/11/2019   Encounter for prostate cancer screening 11/11/2019   Thyromegaly - [place] 2019 normal 08/18/2017   Eczema 01/23/2016   Prediabetes 03/12/2015   Essential hypertension, benign 03/09/2015   GERD (gastroesophageal reflux disease) 03/09/2015   Seizures ([place]) 03/09/2015   Morbid obesity ([place]) 03/09/2015    Current Outpatient Medications on File Prior to Visit  Medication Sig Dispense Refill   hydrochlorothiazide (HYDRODIURIL) 25 MG tablet Take 1 tablet (25 mg total) by mouth daily. 90 tablet 1   levETIRAcetam (KEPPRA) 500 MG tablet TAKE 1 TABLET BY MOUTH TWICE A DAY 180 tablet 1   Melatonin 10 MG CAPS Take 1 cap nightly 30 capsule    No current facility-administered medications on file prior to visit.    Past Medical History:  Diagnosis Date   Essential hypertension    GERD (gastroesophageal reflux disease)    Seizures (HCC)     Past Surgical History:  Procedure Laterality Date   tonsilecomy      Social History   Socioeconomic History   Marital status: Married    Spouse name: Not on file   Number of children: 4   Years of education: Not  on file   Highest education level: Not on file  Occupational History   Not on file  Tobacco Use   Smoking status: Never   Smokeless tobacco: Never  Substance and Sexual Activity   Alcohol use: Yes    Alcohol/week: 0.0 standard drinks    Comment: rare   Drug use: No   Sexual activity: Not on file  Other Topics Concern   Not on file  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  Respiratory:  Negative for cough, shortness of breath and wheezing.   Cardiovascular:  Negative for chest pain, palpitations and leg swelling.  Gastrointestinal:  Negative for abdominal pain and nausea.  Neurological:  Negative for seizures, light-headedness and headaches.      Objective:   Vitals:   05/16/21 0842  BP: 132/78  Pulse: 75  Temp: 98.5 F (36.9 C)  SpO2: 98%  BP Readings from Last 3 Encounters:  05/16/21 132/78  11/13/20 136/88  05/15/20 140/82   Wt Readings from Last 3 Encounters:  05/16/21 298 lb (135.2 kg)  11/13/20 288 lb 12.8 oz (131 kg)  05/15/20 290 lb (131.5 kg)   Body mass index is 37.25 kg/m.   Physical Exam    Constitutional: Appears well-developed and well-nourished. No distress.  HENT:  Head: Normocephalic and atraumatic.  Neck: Neck supple. No tracheal deviation present. No thyromegaly present.  No cervical lymphadenopathy Cardiovascular: Normal rate, regular rhythm and normal heart sounds.   No murmur heard. No carotid bruit .  No edema Pulmonary/Chest: Effort normal and breath  sounds normal. No respiratory distress. No has no wheezes. No rales.  Skin: Skin is warm and dry. Not diaphoretic.  Psychiatric: Normal mood and affect. Behavior is normal.      Assessment & Plan:    See Problem List for Assessment and Plan of chronic medical problems.

## 2021-05-13 NOTE — Patient Instructions (Addendum)
° °  Blood work was ordered.     Medications changes include :   nexium 40 mg daily  Your prescription(s) have been submitted to your pharmacy. Please take as directed and contact our office if you believe you are having problem(s) with the medication(s).     Please followup in 6 months

## 2021-05-16 ENCOUNTER — Encounter: Payer: Self-pay | Admitting: Internal Medicine

## 2021-05-16 ENCOUNTER — Other Ambulatory Visit: Payer: Self-pay

## 2021-05-16 ENCOUNTER — Ambulatory Visit: Payer: No Typology Code available for payment source | Admitting: Internal Medicine

## 2021-05-16 VITALS — BP 132/78 | HR 75 | Temp 98.5°F | Ht 75.0 in | Wt 298.0 lb

## 2021-05-16 DIAGNOSIS — I10 Essential (primary) hypertension: Secondary | ICD-10-CM

## 2021-05-16 DIAGNOSIS — R569 Unspecified convulsions: Secondary | ICD-10-CM

## 2021-05-16 DIAGNOSIS — R7303 Prediabetes: Secondary | ICD-10-CM

## 2021-05-16 DIAGNOSIS — K219 Gastro-esophageal reflux disease without esophagitis: Secondary | ICD-10-CM

## 2021-05-16 LAB — HEMOGLOBIN A1C: Hgb A1c MFr Bld: 5.9 % (ref 4.6–6.5)

## 2021-05-16 MED ORDER — ESOMEPRAZOLE MAGNESIUM 40 MG PO CPDR: 40.0000 mg | DELAYED_RELEASE_CAPSULE | Freq: Every day | ORAL | 3 refills | Status: DC

## 2021-05-16 NOTE — Assessment & Plan Note (Signed)
Chronic Seizures well controlled-last seizure was over 10 years ago Continue Keppra 500 mg twice daily Will check Keppra level

## 2021-05-16 NOTE — Assessment & Plan Note (Signed)
Chronic Check a1c Low sugar / carb diet Stressed regular exercise  

## 2021-05-16 NOTE — Assessment & Plan Note (Signed)
Chronic Blood pressure well controlled CMP Continue HCTZ 25 mg daily 

## 2021-05-16 NOTE — Assessment & Plan Note (Addendum)
Chronic With htn, prediabetes Stressed regular exercise Stressed decrease portions, diet low in sugars and carbohydrates

## 2021-05-16 NOTE — Assessment & Plan Note (Addendum)
Chronic Has started having GERD - taking his wife's nexium GERD controlled with nexium Continue nexium 40 mg daily

## 2021-05-17 LAB — COMPREHENSIVE METABOLIC PANEL
ALT: 25 U/L (ref 0–53)
AST: 19 U/L (ref 0–37)
Albumin: 3.9 g/dL (ref 3.5–5.2)
Alkaline Phosphatase: 92 U/L (ref 39–117)
BUN: 19 mg/dL (ref 6–23)
CO2: 32 mEq/L (ref 19–32)
Calcium: 9.4 mg/dL (ref 8.4–10.5)
Chloride: 101 mEq/L (ref 96–112)
Creatinine, Ser: 1.34 mg/dL (ref 0.40–1.50)
GFR: 64.3 mL/min (ref >60.00)
Glucose, Bld: 95 mg/dL (ref 70–99)
Potassium: 4.5 mEq/L (ref 3.5–5.1)
Sodium: 137 mEq/L (ref 135–145)
Total Bilirubin: 0.4 mg/dL (ref 0.2–1.2)
Total Protein: 8.1 g/dL (ref 6.0–8.3)

## 2021-05-21 LAB — LEVETIRACETAM LEVEL: Keppra (Levetiracetam): 16.5 ug/mL

## 2021-08-05 ENCOUNTER — Other Ambulatory Visit: Payer: Self-pay | Admitting: Internal Medicine

## 2021-08-12 ENCOUNTER — Other Ambulatory Visit: Payer: Self-pay | Admitting: Internal Medicine

## 2021-08-14 NOTE — Telephone Encounter (Signed)
Rec'd msg " REQUIRES PA [phone] WILL PAY FOR 90 DAY SUPPLY EVERY 365 DAYS." Do you want to change to omeprazole or submit PA..Chryl Heck ?

## 2021-08-15 ENCOUNTER — Encounter: Payer: Self-pay | Admitting: Internal Medicine

## 2021-08-28 ENCOUNTER — Telehealth: Payer: Self-pay | Admitting: Internal Medicine

## 2021-08-28 NOTE — Telephone Encounter (Signed)
Pt spouse called in and states pt has a rx for Nexium because it works better than OTC. States pharmacy sent PA over but has not received it back. States they will resend today.  Requesting PA to be ASAP to ensure pt gets medication.

## 2021-09-04 NOTE — Telephone Encounter (Signed)
Spoke with spouse today.  They will pick up Omeprazole

## 2021-09-04 NOTE — Telephone Encounter (Signed)
Approved for Omeprazole (not Nexium)  Izaias More[place] Key: [id number] - PA Case ID: [id number] Need help? Call us at [phone] Outcome Approvedtoday Your PA request has been approved. Additional information will be provided in the approval communication. (Message [phone]) Drug Omeprazole '40MG'$  dr capsules Form [employer] Electronic PA Form [phone] NCPDP)

## 2021-09-04 NOTE — Telephone Encounter (Signed)
Kasra Goga (Key: [id number])  Your information has been submitted to [facility]. To check for an updated outcome later, reopen this PA request from your dashboard.  If [employer] has not responded to your request within 24 hours, contact [place] at [phone]. If you think there may be a problem with your PA request, use our live chat feature at the bottom right.

## 2021-11-12 ENCOUNTER — Other Ambulatory Visit: Payer: Self-pay | Admitting: Internal Medicine

## 2021-11-13 NOTE — Patient Instructions (Addendum)
PPD placed today   Blood work was ordered.     Medications changes include :   none   Your prescription(s) have been sent to your pharmacy.    A referral was ordered for gastroenterology for a screening colonscopy.     Someone from that office will call you to schedule an appointment.    Return in about 6 months (around 05/17/2022) for follow up.   Health Maintenance, Male Adopting a healthy lifestyle and getting preventive care are important in promoting health and wellness. Ask your health care provider about: The right schedule for you to have regular tests and exams. Things you can do on your own to prevent diseases and keep yourself healthy. What should I know about diet, weight, and exercise? Eat a healthy diet  Eat a diet that includes plenty of vegetables, fruits, low-fat dairy products, and lean protein. Do not eat a lot of foods that are high in solid fats, added sugars, or sodium. Maintain a healthy weight Body mass index (BMI) is a measurement that can be used to identify possible weight problems. It estimates body fat based on height and weight. Your health care provider can help determine your BMI and help you achieve or maintain a healthy weight. Get regular exercise Get regular exercise. This is one of the most important things you can do for your health. Most adults should: Exercise for at least 150 minutes each week. The exercise should increase your heart rate and make you sweat (moderate-intensity exercise). Do strengthening exercises at least twice a week. This is in addition to the moderate-intensity exercise. Spend less time sitting. Even light physical activity can be beneficial. Watch cholesterol and blood lipids Have your blood tested for lipids and cholesterol at 45 years of age, then have this test every 5 years. You may need to have your cholesterol levels checked more often if: Your lipid or cholesterol levels are high. You are older than 45 years  of age. You are at high risk for heart disease. What should I know about cancer screening? Many types of cancers can be detected early and may often be prevented. Depending on your health history and family history, you may need to have cancer screening at various ages. This may include screening for: Colorectal cancer. Prostate cancer. Skin cancer. Lung cancer. What should I know about heart disease, diabetes, and high blood pressure? Blood pressure and heart disease High blood pressure causes heart disease and increases the risk of stroke. This is more likely to develop in people who have high blood pressure readings or are overweight. Talk with your health care provider about your target blood pressure readings. Have your blood pressure checked: Every 3-5 years if you are 20-52 years of age. Every year if you are 60 years old or older. If you are between the ages of 29 and 71 and are a current or former smoker, ask your health care provider if you should have a one-time screening for abdominal aortic aneurysm (AAA). Diabetes Have regular diabetes screenings. This checks your fasting blood sugar level. Have the screening done: Once every three years after age 69 if you are at a normal weight and have a low risk for diabetes. More often and at a younger age if you are overweight or have a high risk for diabetes. What should I know about preventing infection? Hepatitis B If you have a higher risk for hepatitis B, you should be screened for this virus. Talk with your health  care provider to find out if you are at risk for hepatitis B infection. Hepatitis C Blood testing is recommended for: Everyone born from [birth year] through 1964. Anyone with known risk factors for hepatitis C. Sexually transmitted infections (STIs) You should be screened each year for STIs, including gonorrhea and chlamydia, if: You are sexually active and are younger than 45 years of age. You are older than 45 years of age  and your health care provider tells you that you are at risk for this type of infection. Your sexual activity has changed since you were last screened, and you are at increased risk for chlamydia or gonorrhea. Ask your health care provider if you are at risk. Ask your health care provider about whether you are at high risk for HIV. Your health care provider may recommend a prescription medicine to help prevent HIV infection. If you choose to take medicine to prevent HIV, you should first get tested for HIV. You should then be tested every 3 months for as long as you are taking the medicine. Follow these instructions at home: Alcohol use Do not drink alcohol if your health care provider tells you not to drink. If you drink alcohol: Limit how much you have to 0-2 drinks a day. Know how much alcohol is in your drink. In the [place] one drink equals one 12 oz bottle of beer (355 mL), one 5 oz glass of wine (148 mL), or one 1 oz glass of hard liquor (44 mL). Lifestyle Do not use any products that contain nicotine or tobacco. These products include cigarettes, chewing tobacco, and vaping devices, such as e-cigarettes. If you need help quitting, ask your health care provider. Do not use street drugs. Do not share needles. Ask your health care provider for help if you need support or information about quitting drugs. General instructions Schedule regular health, dental, and eye exams. Stay current with your vaccines. Tell your health care provider if: You often feel depressed. You have ever been abused or do not feel safe at home. Summary Adopting a healthy lifestyle and getting preventive care are important in promoting health and wellness. Follow your health care provider's instructions about healthy diet, exercising, and getting tested or screened for diseases. Follow your health care provider's instructions on monitoring your cholesterol and blood pressure. This information is not intended to  replace advice given to you by your health care provider. Make sure you discuss any questions you have with your health care provider. Document Revised: 08/14/2020 Document Reviewed: 08/14/2020 Elsevier Patient Education  [place].

## 2021-11-13 NOTE — Progress Notes (Unsigned)
Subjective:    Patient ID: James Pitts, male    DOB: 08/25/1975, 46 y.o.   MRN: [id number]     HPI Conor is here for a physical exam.   Had tendinitis of left knee. It is better.  Had to stop working out for a while.     Medications and allergies reviewed with patient and updated if appropriate.  Current Outpatient Medications on File Prior to Visit  Medication Sig Dispense Refill   hydrochlorothiazide (HYDRODIURIL) 25 MG tablet TAKE 1 TABLET (25 MG TOTAL) BY MOUTH DAILY. 90 tablet 1   levETIRAcetam (KEPPRA) 500 MG tablet TAKE 1 TABLET BY MOUTH TWICE A DAY 180 tablet 1   Melatonin 10 MG CAPS Take 1 cap nightly 30 capsule    omeprazole (PRILOSEC) 40 MG capsule Take 1 capsule (40 mg total) by mouth daily. 30 capsule 3   No current facility-administered medications on file prior to visit.    Review of Systems  Constitutional:  Negative for chills and fever.  Eyes:  Negative for visual disturbance.  Respiratory:  Negative for cough, shortness of breath and wheezing.   Cardiovascular:  Negative for chest pain, palpitations and leg swelling.  Gastrointestinal:  Negative for abdominal pain, blood in stool, constipation, diarrhea and nausea.  Genitourinary:  Negative for difficulty urinating, dysuria and hematuria.  Musculoskeletal:  Positive for back pain (chronic lower back pain). Negative for arthralgias.  Skin:  Negative for rash.  Neurological:  Negative for light-headedness and headaches.  Psychiatric/Behavioral:  Negative for dysphoric mood. The patient is not nervous/anxious.        Objective:   Vitals:   11/14/21 0836  BP: 130/88  Pulse: 73  Temp: 98.5 F (36.9 C)  SpO2: 99%   Filed Weights   11/14/21 0836  Weight: 286 lb (129.7 kg)   Body mass index is 35.75 kg/m.  BP Readings from Last 3 Encounters:  11/14/21 130/88  05/16/21 132/78  11/13/20 136/88    Wt Readings from Last 3 Encounters:  11/14/21 286 lb (129.7 kg)  05/16/21 298 lb (135.2 kg)   11/13/20 288 lb 12.8 oz (131 kg)      Physical Exam Constitutional: He appears well-developed and well-nourished. No distress.  HENT:  Head: Normocephalic and atraumatic.  Right Ear: External ear normal.  Left Ear: External ear normal.  Mouth/Throat: Oropharynx is clear and moist.  Normal ear canals and TM b/l  Eyes: Conjunctivae and EOM are normal.  Neck: Neck supple. No tracheal deviation present. No thyromegaly present.  No carotid bruit  Cardiovascular: Normal rate, regular rhythm, normal heart sounds and intact distal pulses.   No murmur heard. Pulmonary/Chest: Effort normal and breath sounds normal. No respiratory distress. He has no wheezes. He has no rales.  Abdominal: Soft. He exhibits no distension. There is no tenderness.  Genitourinary: deferred  Musculoskeletal: He exhibits no edema.  Lymphadenopathy:   He has no cervical adenopathy.  Skin: Skin is warm and dry. He is not diaphoretic.  Psychiatric: He has a normal mood and affect. His behavior is normal.         Assessment & Plan:   Physical exam: Screening blood work  ordered Exercise   not right now -- recovering from l knee tendinitiis Weight  stressed weight loss Substance abuse   none   Reviewed recommended immunizations.  PPD for work   Health Maintenance  Topic Date Due   COLONOSCOPY (Pts 45-23yr Insurance coverage will need to be confirmed)  Never done  INFLUENZA VACCINE  07/07/2022 (Originally 11/06/2021)   TETANUS/TDAP  01/22/2026   COVID-19 Vaccine  Completed   Hepatitis C Screening  Completed   HIV Screening  Completed   HPV VACCINES  Aged Out     See Problem List for Assessment and Plan of chronic medical problems.

## 2021-11-14 ENCOUNTER — Ambulatory Visit (INDEPENDENT_AMBULATORY_CARE_PROVIDER_SITE_OTHER): Payer: No Typology Code available for payment source | Admitting: Internal Medicine

## 2021-11-14 ENCOUNTER — Encounter: Payer: Self-pay | Admitting: Internal Medicine

## 2021-11-14 VITALS — BP 130/88 | HR 73 | Temp 98.5°F | Ht 75.0 in | Wt 286.0 lb

## 2021-11-14 DIAGNOSIS — R569 Unspecified convulsions: Secondary | ICD-10-CM

## 2021-11-14 DIAGNOSIS — I10 Essential (primary) hypertension: Secondary | ICD-10-CM | POA: Diagnosis not present

## 2021-11-14 DIAGNOSIS — D649 Anemia, unspecified: Secondary | ICD-10-CM | POA: Diagnosis not present

## 2021-11-14 DIAGNOSIS — Z0000 Encounter for general adult medical examination without abnormal findings: Secondary | ICD-10-CM | POA: Diagnosis not present

## 2021-11-14 DIAGNOSIS — R7303 Prediabetes: Secondary | ICD-10-CM | POA: Diagnosis not present

## 2021-11-14 DIAGNOSIS — Z111 Encounter for screening for respiratory tuberculosis: Secondary | ICD-10-CM

## 2021-11-14 DIAGNOSIS — Z125 Encounter for screening for malignant neoplasm of prostate: Secondary | ICD-10-CM | POA: Diagnosis not present

## 2021-11-14 DIAGNOSIS — L309 Dermatitis, unspecified: Secondary | ICD-10-CM

## 2021-11-14 DIAGNOSIS — K219 Gastro-esophageal reflux disease without esophagitis: Secondary | ICD-10-CM

## 2021-11-14 DIAGNOSIS — Z1211 Encounter for screening for malignant neoplasm of colon: Secondary | ICD-10-CM

## 2021-11-14 LAB — CBC WITH DIFFERENTIAL/PLATELET
Basophils Absolute: 0 10*3/uL (ref 0.0–0.1)
Basophils Relative: 0.6 % (ref 0.0–3.0)
Eosinophils Absolute: 0.3 10*3/uL (ref 0.0–0.7)
Eosinophils Relative: 5.5 % — ABNORMAL HIGH (ref 0.0–5.0)
HCT: 39.4 % (ref 39.0–52.0)
Hemoglobin: 13.2 g/dL (ref 13.0–17.0)
Lymphocytes Relative: 26.6 % (ref 12.0–46.0)
Lymphs Abs: 1.5 10*3/uL (ref 0.7–4.0)
MCHC: 33.5 g/dL (ref 30.0–36.0)
MCV: 91.4 fl (ref 78.0–100.0)
Monocytes Absolute: 0.5 10*3/uL (ref 0.1–1.0)
Monocytes Relative: 8.3 % (ref 3.0–12.0)
Neutro Abs: 3.3 10*3/uL (ref 1.4–7.7)
Neutrophils Relative %: 59 % (ref 43.0–77.0)
Platelets: 464 10*3/uL — ABNORMAL HIGH (ref 150.0–400.0)
RBC: 4.31 Mil/uL (ref 4.22–5.81)
RDW: 13.4 % (ref 11.5–15.5)
WBC: 5.6 10*3/uL (ref 4.0–10.5)

## 2021-11-14 LAB — IBC PANEL
Iron: 75 ug/dL (ref 42–165)
Saturation Ratios: 18.3 % — ABNORMAL LOW (ref 20.0–50.0)
TIBC: 410.2 ug/dL (ref 250.0–450.0)
Transferrin: 293 mg/dL (ref 212.0–360.0)

## 2021-11-14 LAB — COMPREHENSIVE METABOLIC PANEL
ALT: 23 U/L (ref 0–53)
AST: 19 U/L (ref 0–37)
Albumin: 4.4 g/dL (ref 3.5–5.2)
Alkaline Phosphatase: 114 U/L (ref 39–117)
BUN: 19 mg/dL (ref 6–23)
CO2: 29 mEq/L (ref 19–32)
Calcium: 9.9 mg/dL (ref 8.4–10.5)
Chloride: 97 mEq/L (ref 96–112)
Creatinine, Ser: 1.37 mg/dL (ref 0.40–1.50)
GFR: 62.4 mL/min (ref >60.00)
Glucose, Bld: 91 mg/dL (ref 70–99)
Potassium: 4.9 mEq/L (ref 3.5–5.1)
Sodium: 134 mEq/L — ABNORMAL LOW (ref 135–145)
Total Bilirubin: 0.4 mg/dL (ref 0.2–1.2)
Total Protein: 8.4 g/dL — ABNORMAL HIGH (ref 6.0–8.3)

## 2021-11-14 LAB — LIPID PANEL
Cholesterol: 181 mg/dL (ref 0–200)
HDL: 49.5 mg/dL (ref >39.00)
LDL Cholesterol: 117 mg/dL — ABNORMAL HIGH (ref 0–99)
NonHDL: 131.9
Total CHOL/HDL Ratio: 4
Triglycerides: 73 mg/dL (ref 0.0–149.0)
VLDL: 14.6 mg/dL (ref 0.0–40.0)

## 2021-11-14 LAB — PSA: PSA: 0.95 ng/mL (ref 0.10–4.00)

## 2021-11-14 LAB — HEMOGLOBIN A1C: Hgb A1c MFr Bld: 6 % (ref 4.6–6.5)

## 2021-11-14 LAB — FERRITIN: Ferritin: 90.6 ng/mL (ref 22.0–322.0)

## 2021-11-14 MED ORDER — LEVETIRACETAM 500 MG PO TABS: 500.0000 mg | ORAL_TABLET | Freq: Two times a day (BID) | ORAL | 1 refills | Status: DC

## 2021-11-14 MED ORDER — HYDROCHLOROTHIAZIDE 25 MG PO TABS: 25.0000 mg | ORAL_TABLET | Freq: Every day | ORAL | 1 refills | Status: DC

## 2021-11-14 MED ORDER — OMEPRAZOLE 40 MG PO CPDR: 40.0000 mg | DELAYED_RELEASE_CAPSULE | Freq: Every day | ORAL | 3 refills | Status: DC

## 2021-11-14 MED ORDER — TRIAMCINOLONE ACETONIDE 0.1 % EX CREA: 1.0000 | TOPICAL_CREAM | Freq: Two times a day (BID) | CUTANEOUS | 2 refills | Status: DC

## 2021-11-14 NOTE — Assessment & Plan Note (Signed)
Chronic Check a1c Low sugar / carb diet Stressed regular exercise  

## 2021-11-14 NOTE — Addendum Note (Signed)
Addended by: Marcina Millard on: 11/14/2021 09:36 AM   Modules accepted: Orders

## 2021-11-14 NOTE — Assessment & Plan Note (Signed)
Chronic Encourage regular exercise-at least 5 days a week for 30 minutes Stress decrease portions.  Stressed diet high in protein and vegetables and low in sugars and carbs

## 2021-11-14 NOTE — Assessment & Plan Note (Signed)
Chronic Well-controlled No seizure activity No longer seeing neurology given stability Continue Keppra 500 mg twice daily

## 2021-11-14 NOTE — Assessment & Plan Note (Addendum)
Chronic Intermittent flares Uses steroid cream - using daughter's cream Start triamcinolone 0.1% cream bid prn

## 2021-11-14 NOTE — Assessment & Plan Note (Signed)
Chronic GERD controlled Continue omeprazole 40 mg daily Encourage weight loss Encourage GERD diet Discussed importance of trying to taper off of omeprazole

## 2021-11-14 NOTE — Assessment & Plan Note (Signed)
Check PSA. ?

## 2021-11-14 NOTE — Assessment & Plan Note (Signed)
Chronic Blood pressure well controlled CMP Continue HCTZ 25 mg daily 

## 2021-11-16 ENCOUNTER — Ambulatory Visit: Payer: No Typology Code available for payment source

## 2021-11-16 LAB — TB SKIN TEST
Induration: 0 mm
TB Skin Test: NEGATIVE

## 2021-11-23 ENCOUNTER — Encounter: Payer: Self-pay | Admitting: Gastroenterology

## 2021-12-13 ENCOUNTER — Ambulatory Visit: Payer: No Typology Code available for payment source | Admitting: *Deleted

## 2021-12-13 VITALS — Ht 75.0 in | Wt 288.8 lb

## 2021-12-13 DIAGNOSIS — Z1211 Encounter for screening for malignant neoplasm of colon: Secondary | ICD-10-CM

## 2021-12-13 MED ORDER — NA SULFATE-K SULFATE-MG SULF 17.5-3.13-1.6 GM/177ML PO SOLN: 1.0000 | Freq: Once | ORAL | 0 refills | Status: AC

## 2021-12-13 NOTE — Progress Notes (Signed)
No egg or soy allergy known to patient  No issues known to pt with past sedation with any surgeries or procedures Patient denies ever being told they had issues or difficulty with intubation  No FH of Malignant Hyperthermia Pt is not on diet pills Pt is not on  home 02  Pt is not on blood thinners  Pt denies issues with constipation  No A fib or A flutter Have any cardiac testing pending--no Pt instructed to use [email or web address] or GoodRx for a price reduction on prep   

## 2021-12-24 ENCOUNTER — Encounter: Payer: Self-pay | Admitting: Gastroenterology

## 2022-01-08 ENCOUNTER — Ambulatory Visit (AMBULATORY_SURGERY_CENTER): Payer: No Typology Code available for payment source | Admitting: Gastroenterology

## 2022-01-08 ENCOUNTER — Encounter: Payer: Self-pay | Admitting: Gastroenterology

## 2022-01-08 VITALS — BP 134/92 | HR 86 | Temp 98.4°F | Resp 17 | Ht 75.0 in | Wt 288.8 lb

## 2022-01-08 DIAGNOSIS — Z1211 Encounter for screening for malignant neoplasm of colon: Secondary | ICD-10-CM

## 2022-01-08 DIAGNOSIS — D122 Benign neoplasm of ascending colon: Secondary | ICD-10-CM | POA: Diagnosis not present

## 2022-01-08 DIAGNOSIS — K5730 Diverticulosis of large intestine without perforation or abscess without bleeding: Secondary | ICD-10-CM

## 2022-01-08 MED ORDER — SODIUM CHLORIDE 0.9 % IV SOLN: 500.0000 mL | Freq: Once | INTRAVENOUS | Status: DC

## 2022-01-08 NOTE — Patient Instructions (Signed)
   Handouts on polyps & diverticulosis given to you today   Await pathology results on polyp removed    YOU HAD AN ENDOSCOPIC PROCEDURE TODAY AT THE [facility]:   Refer to the procedure report that was given to you for any specific questions about what was found during the examination.  If the procedure report does not answer your questions, please call your gastroenterologist to clarify.  If you requested that your care partner not be given the details of your procedure findings, then the procedure report has been included in a sealed envelope for you to review at your convenience later.  YOU SHOULD EXPECT: Some feelings of bloating in the abdomen. Passage of more gas than usual.  Walking can help get rid of the air that was put into your GI tract during the procedure and reduce the bloating. If you had a lower endoscopy (such as a colonoscopy or flexible sigmoidoscopy) you may notice spotting of blood in your stool or on the toilet paper. If you underwent a bowel prep for your procedure, you may not have a normal bowel movement for a few days.  Please Note:  You might notice some irritation and congestion in your nose or some drainage.  This is from the oxygen used during your procedure.  There is no need for concern and it should clear up in a day or so.  SYMPTOMS TO REPORT IMMEDIATELY:  Following lower endoscopy (colonoscopy or flexible sigmoidoscopy):  Excessive amounts of blood in the stool  Significant tenderness or worsening of abdominal pains  Swelling of the abdomen that is new, acute  Fever of 100F or higher   For urgent or emergent issues, a gastroenterologist can be reached at any hour by calling [phone]. Do not use MyChart messaging for urgent concerns.    DIET:  We do recommend a small meal at first, but then you may proceed to your regular diet.  Drink plenty of fluids but you should avoid alcoholic beverages for 24 hours.  ACTIVITY:  You should  plan to take it easy for the rest of today and you should NOT DRIVE or use heavy machinery until tomorrow (because of the sedation medicines used during the test).    FOLLOW UP: Our staff will call the number listed on your records the next business day following your procedure.  We will call around 7:15- 8:00 am to check on you and address any questions or concerns that you may have regarding the information given to you following your procedure. If we do not reach you, we will leave a message.     If any biopsies were taken you will be contacted by phone or by letter within the next 1-3 weeks.  Please call us at [phone] if you have not heard about the biopsies in 3 weeks.    SIGNATURES/CONFIDENTIALITY: You and/or your care partner have signed paperwork which will be entered into your electronic medical record.  These signatures attest to the fact that that the information above on your After Visit Summary has been reviewed and is understood.  Full responsibility of the confidentiality of this discharge information lies with you and/or your care-partner. 

## 2022-01-08 NOTE — Progress Notes (Signed)
Pt's states no medical or surgical changes since previsit or office visit. 

## 2022-01-08 NOTE — Progress Notes (Signed)
GASTROENTEROLOGY PROCEDURE H&P NOTE   Primary Care Physician: Binnie Rail, MD    Reason for Procedure:  Colon Cancer screening  Plan:    Colonoscopy  Patient is appropriate for endoscopic procedure(s) in the ambulatory ([facility]) setting.  The nature of the procedure, as well as the risks, benefits, and alternatives were carefully and thoroughly reviewed with the patient. Ample time for discussion and questions allowed. The patient understood, was satisfied, and agreed to proceed.     HPI: James Pitts is a 46 y.o. male who presents for colonoscopy for routine Colon Cancer screening.  No active GI symptoms.  No known family history of colon cancer or related malignancy.  Patient is otherwise without complaints or active issues today.  Past Medical History:  Diagnosis Date   Essential hypertension    GERD (gastroesophageal reflux disease)    Seizures ([place])    last seizure January 2012    Past Surgical History:  Procedure Laterality Date   tonsilecomy      Prior to Admission medications   Medication Sig Start Date End Date Taking? Authorizing Provider  hydrochlorothiazide (HYDRODIURIL) 25 MG tablet Take 1 tablet (25 mg total) by mouth daily. 11/14/21  Yes Burns, Claudina Lick, MD  levETIRAcetam (KEPPRA) 500 MG tablet Take 1 tablet (500 mg total) by mouth 2 (two) times daily. 11/14/21  Yes Binnie Rail, MD  Melatonin 10 MG CAPS Take 1 cap nightly 05/15/20   Binnie Rail, MD  omeprazole (PRILOSEC) 40 MG capsule Take 1 capsule (40 mg total) by mouth daily. 11/14/21   Binnie Rail, MD  triamcinolone cream (KENALOG) 0.1 % Apply 1 Application topically 2 (two) times daily. 11/14/21   Binnie Rail, MD    Current Outpatient Medications  Medication Sig Dispense Refill   hydrochlorothiazide (HYDRODIURIL) 25 MG tablet Take 1 tablet (25 mg total) by mouth daily. 90 tablet 1   levETIRAcetam (KEPPRA) 500 MG tablet Take 1 tablet (500 mg total) by mouth 2 (two) times daily. 180 tablet 1    Melatonin 10 MG CAPS Take 1 cap nightly 30 capsule    omeprazole (PRILOSEC) 40 MG capsule Take 1 capsule (40 mg total) by mouth daily. 90 capsule 3   triamcinolone cream (KENALOG) 0.1 % Apply 1 Application topically 2 (two) times daily. 45 g 2   Current Facility-Administered Medications  Medication Dose Route Frequency Provider Last Rate Last Admin   0.9 %  sodium chloride infusion  500 mL Intravenous Once ,  V, DO        Allergies as of 01/08/2022 - Review Complete 01/08/2022  Allergen Reaction Noted   Amlodipine Other (See Comments) 06/15/2015   Lisinopril Other (See Comments) 06/15/2015   Lamictal [lamotrigine] Nausea And Vomiting and Rash 03/09/2015    Family History  Problem Relation Age of Onset   Hypertension Mother    Multiple sclerosis Mother    Hypertension Father    Asthma Father    Prostate cancer Father 63   Colon cancer Neg Hx    Stomach cancer Neg Hx    Esophageal cancer Neg Hx     Social History   Socioeconomic History   Marital status: Married    Spouse name: Not on file   Number of children: 4   Years of education: Not on file   Highest education level: Not on file  Occupational History   Not on file  Tobacco Use   Smoking status: Never   Smokeless tobacco: Never  Substance  and Sexual Activity   Alcohol use: Yes    Alcohol/week: 0.0 standard drinks of alcohol    Comment: rare   Drug use: No   Sexual activity: Not on file  Other Topics Concern   Not on file  Social History Narrative   Occupational therapist   Social Determinants of Health   Financial Resource Strain: Not on file  Food Insecurity: Not on file  Transportation Needs: Not on file  Physical Activity: Not on file  Stress: Not on file  Social Connections: Not on file  Intimate Partner Violence: Not on file    Physical Exam: Vital signs in last 24 hours: '@BP'$  131/86   Pulse 88   Temp 98.4 F (36.9 C) (Temporal)   Ht '6\' 3"'$  (1.905 m)   Wt 288 lb 12.8 oz (131  kg)   SpO2 99%   BMI 36.10 kg/m  GEN: NAD EYE: Sclerae anicteric ENT: MMM CV: Non-tachycardic Pulm: CTA b/l GI: Soft, NT/ND NEURO:  Alert & Oriented x 3   Gerrit Heck, DO [facility]   01/08/2022 8:28 AM

## 2022-01-08 NOTE — Op Note (Signed)
[facility] Patient Name: James Pitts Procedure Date: 01/08/2022 8:32 AM MRN: [id number] Endoscopist: Gerrit Heck , MD Age: 46 Referring MD:  Date of Birth: Apr 30, 1975 Gender: Male Account #: [id number] Procedure:                Colonoscopy Indications:              Screening for colorectal malignant neoplasm, This                            is the patient's first colonoscopy Medicines:                Monitored Anesthesia Care Procedure:                Pre-Anesthesia Assessment:                           - Prior to the procedure, a History and Physical                            was performed, and patient medications and                            allergies were reviewed. The patient's tolerance of                            previous anesthesia was also reviewed. The risks                            and benefits of the procedure and the sedation                            options and risks were discussed with the patient.                            All questions were answered, and informed consent                            was obtained. Prior Anticoagulants: The patient has                            taken no previous anticoagulant or antiplatelet                            agents. ASA Grade Assessment: II - A patient with                            mild systemic disease. After reviewing the risks                            and benefits, the patient was deemed in                            satisfactory condition to undergo the procedure.  After obtaining informed consent, the colonoscope                            was passed under direct vision. Throughout the                            procedure, the patient's blood pressure, pulse, and                            oxygen saturations were monitored continuously. The                            CF [id number] #[id number] was introduced through the anus                            and advanced to the the  cecum, identified by                            appendiceal orifice and ileocecal valve. The                            colonoscopy was performed without difficulty. The                            colonoscopy was performed without difficulty. The                            patient tolerated the procedure well. The quality                            of the bowel preparation was good. The ileocecal                            valve, appendiceal orifice, and rectum were                            photographed. Scope In: 8:34:38 AM Scope Out: 7:56:43 AM Scope Withdrawal Time: 0 hours 11 minutes 15 seconds  Total Procedure Duration: 0 hours 14 minutes 40 seconds  Findings:                 The perianal and digital rectal examinations were                            normal.                           A 2 mm polyp was found in the ascending colon. The                            polyp was sessile. The polyp was removed with a                            cold biopsy forceps. Resection and retrieval were  complete. Estimated blood loss was minimal.                           Multiple small and large-mouthed diverticula were                            found in the sigmoid colon, ascending colon and                            cecum.                           The retroflexed view of the distal rectum and anal                            verge was normal and showed no anal or rectal                            abnormalities. Complications:            No immediate complications. Estimated Blood Loss:     Estimated blood loss was minimal. Impression:               - One 2 mm polyp in the ascending colon, removed                            with a cold biopsy forceps. Resected and retrieved.                           - Diverticulosis in the sigmoid colon, in the                            ascending colon and in the cecum.                           - The distal rectum and anal verge are  normal on                            retroflexion view. Recommendation:           - Patient has a contact number available for                            emergencies. The signs and symptoms of potential                            delayed complications were discussed with the                            patient. Return to normal activities tomorrow.                            Written discharge instructions were provided to the  patient.                           - Resume previous diet.                           - Continue present medications.                           - Await pathology results.                           - Repeat colonoscopy in 5-10 years for surveillance                            based on pathology results.                           - Return to GI office PRN. Gerrit Heck, MD 01/08/2022 8:54:22 AM

## 2022-01-08 NOTE — Progress Notes (Signed)
To pacu, VSS. Report to [email or web address] 

## 2022-01-08 NOTE — Progress Notes (Signed)
Called to room to assist during endoscopic procedure.  Patient ID and intended procedure confirmed with present staff. Received instructions for my participation in the procedure from the performing physician.  

## 2022-01-09 ENCOUNTER — Telehealth: Payer: Self-pay

## 2022-01-09 NOTE — Telephone Encounter (Signed)
Attempted f/u call. No answer, left VM. 

## 2022-01-15 ENCOUNTER — Encounter: Payer: Self-pay | Admitting: Gastroenterology

## 2022-05-21 NOTE — Progress Notes (Signed)
Subjective:    Patient ID: James Pitts, male    DOB: 1976-03-15, 47 y.o.   MRN: [id number]     HPI James Pitts is here for follow up of his chronic medical problems, including htn, prediabetes, seizure d/o, morbid obesity, GERD   Overall doing well.   Around new years he had neck pain - still has some residual pain/stiffness.  He is exercising regularly - trying to get it more regularly.    Medications and allergies reviewed with patient and updated if appropriate.  Current Outpatient Medications on File Prior to Visit  Medication Sig Dispense Refill   hydrochlorothiazide (HYDRODIURIL) 25 MG tablet Take 1 tablet (25 mg total) by mouth daily. 90 tablet 1   levETIRAcetam (KEPPRA) 500 MG tablet Take 1 tablet (500 mg total) by mouth 2 (two) times daily. 180 tablet 1   Melatonin 10 MG CAPS Take 1 cap nightly 30 capsule    omeprazole (PRILOSEC) 40 MG capsule Take 1 capsule (40 mg total) by mouth daily. 90 capsule 3   triamcinolone cream (KENALOG) 0.1 % Apply 1 Application topically 2 (two) times daily. 45 g 2   No current facility-administered medications on file prior to visit.     Review of Systems  Constitutional:  Negative for fever.  Respiratory:  Negative for cough, shortness of breath and wheezing.   Cardiovascular:  Negative for chest pain, palpitations and leg swelling.  Neurological:  Negative for light-headedness and headaches.       Objective:   Vitals:   05/22/22 0838 05/22/22 0855  BP: (!) 140/88 134/78  Pulse: 73   Temp: 98.3 F (36.8 C)   SpO2: 97%    BP Readings from Last 3 Encounters:  05/22/22 134/78  01/08/22 (!) 134/92  11/14/21 130/88   Wt Readings from Last 3 Encounters:  05/22/22 292 lb (132.5 kg)  01/08/22 288 lb 12.8 oz (131 kg)  12/13/21 288 lb 12.8 oz (131 kg)   Body mass index is 36.5 kg/m.    Physical Exam Constitutional:      General: He is not in acute distress.    Appearance: Normal appearance. He is not ill-appearing.   HENT:     Head: Normocephalic and atraumatic.  Eyes:     Conjunctiva/sclera: Conjunctivae normal.  Cardiovascular:     Rate and Rhythm: Normal rate and regular rhythm.     Heart sounds: Normal heart sounds. No murmur heard. Pulmonary:     Effort: Pulmonary effort is normal. No respiratory distress.     Breath sounds: Normal breath sounds. No wheezing or rales.  Musculoskeletal:     Right lower leg: No edema.     Left lower leg: No edema.  Skin:    General: Skin is warm and dry.     Findings: No rash.  Neurological:     Mental Status: He is alert. Mental status is at baseline.  Psychiatric:        Mood and Affect: Mood normal.        Lab Results  Component Value Date   WBC 5.6 11/14/2021   HGB 13.2 11/14/2021   HCT 39.4 11/14/2021   PLT 464.0 (H) 11/14/2021   GLUCOSE 91 11/14/2021   CHOL 181 11/14/2021   TRIG 73.0 11/14/2021   HDL 49.50 11/14/2021   LDLCALC 117 (H) 11/14/2021   ALT 23 11/14/2021   AST 19 11/14/2021   NA 134 (L) 11/14/2021   K 4.9 11/14/2021   CL 97 11/14/2021  CREATININE 1.37 11/14/2021   BUN 19 11/14/2021   CO2 29 11/14/2021   TSH 2.59 05/15/2020   PSA 0.95 11/14/2021   HGBA1C 6.0 11/14/2021     Assessment & Plan:    See Problem List for Assessment and Plan of chronic medical problems.

## 2022-05-21 NOTE — Patient Instructions (Addendum)
      Blood work was ordered.   The lab is on the first floor.    Medications changes include :   none     Return in about 6 months (around 11/20/2022) for Physical Exam.

## 2022-05-22 ENCOUNTER — Ambulatory Visit: Payer: No Typology Code available for payment source | Admitting: Internal Medicine

## 2022-05-22 ENCOUNTER — Encounter: Payer: Self-pay | Admitting: Internal Medicine

## 2022-05-22 VITALS — BP 134/78 | HR 73 | Temp 98.3°F | Ht 75.0 in | Wt 292.0 lb

## 2022-05-22 DIAGNOSIS — R7303 Prediabetes: Secondary | ICD-10-CM | POA: Diagnosis not present

## 2022-05-22 DIAGNOSIS — I10 Essential (primary) hypertension: Secondary | ICD-10-CM

## 2022-05-22 DIAGNOSIS — K219 Gastro-esophageal reflux disease without esophagitis: Secondary | ICD-10-CM

## 2022-05-22 DIAGNOSIS — R569 Unspecified convulsions: Secondary | ICD-10-CM

## 2022-05-22 LAB — LIPID PANEL
Cholesterol: 166 mg/dL (ref 0–200)
HDL: 56.2 mg/dL (ref >39.00)
LDL Cholesterol: 101 mg/dL — ABNORMAL HIGH (ref 0–99)
NonHDL: 110.24
Total CHOL/HDL Ratio: 3
Triglycerides: 48 mg/dL (ref 0.0–149.0)
VLDL: 9.6 mg/dL (ref 0.0–40.0)

## 2022-05-22 LAB — COMPREHENSIVE METABOLIC PANEL
ALT: 23 U/L (ref 0–53)
AST: 20 U/L (ref 0–37)
Albumin: 4.1 g/dL (ref 3.5–5.2)
Alkaline Phosphatase: 89 U/L (ref 39–117)
BUN: 21 mg/dL (ref 6–23)
CO2: 30 mEq/L (ref 19–32)
Calcium: 9.6 mg/dL (ref 8.4–10.5)
Chloride: 101 mEq/L (ref 96–112)
Creatinine, Ser: 1.49 mg/dL (ref 0.40–1.50)
GFR: 56.21 mL/min — ABNORMAL LOW (ref >60.00)
Glucose, Bld: 101 mg/dL — ABNORMAL HIGH (ref 70–99)
Potassium: 4.7 mEq/L (ref 3.5–5.1)
Sodium: 138 mEq/L (ref 135–145)
Total Bilirubin: 0.3 mg/dL (ref 0.2–1.2)
Total Protein: 8.2 g/dL (ref 6.0–8.3)

## 2022-05-22 LAB — HEMOGLOBIN A1C: Hgb A1c MFr Bld: 5.9 % (ref 4.6–6.5)

## 2022-05-22 NOTE — Assessment & Plan Note (Signed)
Chronic Well-controlled No seizure-like activity in years Continue Keppra 500 mg twice daily No longer following with neurology due to stability on medication

## 2022-05-22 NOTE — Assessment & Plan Note (Signed)
Chronic Blood pressure well controlled CMP Continue hydrochlorothiazide 25 mg daily 

## 2022-05-22 NOTE — Assessment & Plan Note (Signed)
Chronic Stressed regular exercise Discussed decreased portions, diet high in protein, fiber and vegetables and low in sugars and carbohydrates

## 2022-05-22 NOTE — Assessment & Plan Note (Signed)
Chronic Check a1c Low sugar / carb diet Stressed regular exercise  

## 2022-05-22 NOTE — Assessment & Plan Note (Addendum)
Chronic GERD controlled Continue omeprazole 40 mg daily - tries to go every other day Encourage weight loss, GERD diet Discussed ideally we want to get him off this medication

## 2022-08-03 ENCOUNTER — Other Ambulatory Visit: Payer: Self-pay | Admitting: Internal Medicine

## 2022-09-15 ENCOUNTER — Other Ambulatory Visit: Payer: Self-pay | Admitting: Internal Medicine

## 2022-11-06 ENCOUNTER — Encounter (INDEPENDENT_AMBULATORY_CARE_PROVIDER_SITE_OTHER): Payer: Self-pay

## 2022-11-19 NOTE — Progress Notes (Unsigned)
Subjective:    Patient ID: James Pitts, male    DOB: 23-Apr-1975, 47 y.o.   MRN: [id number]     HPI James Pitts is here for follow up of his chronic medical problems.  Cold symptoms started two days ago.   Has been exposed to covid.   No exercise in about 2 months.  Medications and allergies reviewed with patient and updated if appropriate.  Current Outpatient Medications on File Prior to Visit  Medication Sig Dispense Refill   hydrochlorothiazide (HYDRODIURIL) 25 MG tablet TAKE 1 TABLET (25 MG TOTAL) BY MOUTH DAILY. 90 tablet 1   levETIRAcetam (KEPPRA) 500 MG tablet TAKE 1 TABLET BY MOUTH TWICE A DAY 180 tablet 1   Melatonin 10 MG CAPS Take 1 cap nightly 30 capsule    omeprazole (PRILOSEC) 40 MG capsule Take 1 capsule (40 mg total) by mouth daily. 90 capsule 3   triamcinolone cream (KENALOG) 0.1 % Apply 1 Application topically 2 (two) times daily. 45 g 2   No current facility-administered medications on file prior to visit.     Review of Systems  Constitutional:  Positive for fatigue. Negative for fever.  HENT:  Positive for congestion (mild), postnasal drip, sinus pressure and sore throat.        No change taste/smell  Respiratory:  Positive for cough. Negative for shortness of breath and wheezing.   Cardiovascular:  Negative for chest pain, palpitations and leg swelling.  Gastrointestinal:  Negative for diarrhea.  Musculoskeletal:  Negative for myalgias.  Neurological:  Positive for headaches (mild). Negative for light-headedness.       Objective:   Vitals:   11/20/22 0831  BP: (!) 146/90  Pulse: 82  Temp: 98.3 F (36.8 C)  SpO2: 96%   BP Readings from Last 3 Encounters:  11/20/22 (!) 146/90  05/22/22 134/78  01/08/22 (!) 134/92   Wt Readings from Last 3 Encounters:  11/20/22 288 lb (130.6 kg)  05/22/22 292 lb (132.5 kg)  01/08/22 288 lb 12.8 oz (131 kg)   Body mass index is 36 kg/m.    Physical Exam Constitutional:      General: He is not in  acute distress.    Appearance: Normal appearance. He is not ill-appearing.  HENT:     Head: Normocephalic and atraumatic.     Right Ear: Tympanic membrane, ear canal and external ear normal. There is no impacted cerumen.     Left Ear: Tympanic membrane, ear canal and external ear normal. There is no impacted cerumen.     Mouth/Throat:     Mouth: Mucous membranes are moist.     Pharynx: No oropharyngeal exudate or posterior oropharyngeal erythema.  Eyes:     Conjunctiva/sclera: Conjunctivae normal.  Cardiovascular:     Rate and Rhythm: Normal rate and regular rhythm.     Heart sounds: Normal heart sounds.  Pulmonary:     Effort: Pulmonary effort is normal. No respiratory distress.     Breath sounds: Normal breath sounds. No wheezing or rales.  Musculoskeletal:     Cervical back: Neck supple. No tenderness.     Right lower leg: No edema.     Left lower leg: No edema.  Lymphadenopathy:     Cervical: No cervical adenopathy.  Skin:    General: Skin is warm and dry.     Findings: No rash.  Neurological:     Mental Status: He is alert. Mental status is at baseline.  Psychiatric:  Mood and Affect: Mood normal.        Lab Results  Component Value Date   WBC 5.6 11/14/2021   HGB 13.2 11/14/2021   HCT 39.4 11/14/2021   PLT 464.0 (H) 11/14/2021   GLUCOSE 101 (H) 05/22/2022   CHOL 166 05/22/2022   TRIG 48.0 05/22/2022   HDL 56.20 05/22/2022   LDLCALC 101 (H) 05/22/2022   ALT 23 05/22/2022   AST 20 05/22/2022   NA 138 05/22/2022   K 4.7 05/22/2022   CL 101 05/22/2022   CREATININE 1.49 05/22/2022   BUN 21 05/22/2022   CO2 30 05/22/2022   TSH 2.59 05/15/2020   PSA 0.95 11/14/2021   HGBA1C 5.9 05/22/2022     Assessment & Plan:    See Problem List for Assessment and Plan of chronic medical problems.

## 2022-11-19 NOTE — Patient Instructions (Addendum)
      Blood work was ordered.   The lab is on the first floor.    Medications changes include :   start valsartan 40 mg daily for your BP.       Return in about 6 months (around 05/23/2023) for Physical Exam.

## 2022-11-20 ENCOUNTER — Encounter: Payer: Self-pay | Admitting: Internal Medicine

## 2022-11-20 ENCOUNTER — Ambulatory Visit (INDEPENDENT_AMBULATORY_CARE_PROVIDER_SITE_OTHER): Payer: 59 | Admitting: Internal Medicine

## 2022-11-20 VITALS — BP 140/72 | HR 82 | Temp 98.3°F | Ht 75.0 in | Wt 288.0 lb

## 2022-11-20 DIAGNOSIS — I10 Essential (primary) hypertension: Secondary | ICD-10-CM

## 2022-11-20 DIAGNOSIS — R7303 Prediabetes: Secondary | ICD-10-CM | POA: Diagnosis not present

## 2022-11-20 DIAGNOSIS — R569 Unspecified convulsions: Secondary | ICD-10-CM | POA: Diagnosis not present

## 2022-11-20 DIAGNOSIS — J069 Acute upper respiratory infection, unspecified: Secondary | ICD-10-CM | POA: Insufficient documentation

## 2022-11-20 DIAGNOSIS — R944 Abnormal results of kidney function studies: Secondary | ICD-10-CM

## 2022-11-20 DIAGNOSIS — K219 Gastro-esophageal reflux disease without esophagitis: Secondary | ICD-10-CM

## 2022-11-20 LAB — COMPREHENSIVE METABOLIC PANEL
ALT: 34 U/L (ref 0–53)
AST: 30 U/L (ref 0–37)
Albumin: 4.2 g/dL (ref 3.5–5.2)
Alkaline Phosphatase: 104 U/L (ref 39–117)
BUN: 20 mg/dL (ref 6–23)
CO2: 29 mEq/L (ref 19–32)
Calcium: 9.6 mg/dL (ref 8.4–10.5)
Chloride: 99 mEq/L (ref 96–112)
Creatinine, Ser: 1.41 mg/dL (ref 0.40–1.50)
GFR: 59.85 mL/min — ABNORMAL LOW (ref >60.00)
Glucose, Bld: 99 mg/dL (ref 70–99)
Potassium: 4.4 mEq/L (ref 3.5–5.1)
Sodium: 136 mEq/L (ref 135–145)
Total Bilirubin: 0.3 mg/dL (ref 0.2–1.2)
Total Protein: 8.3 g/dL (ref 6.0–8.3)

## 2022-11-20 LAB — HEMOGLOBIN A1C: Hgb A1c MFr Bld: 5.8 % (ref 4.6–6.5)

## 2022-11-20 LAB — LIPID PANEL
Cholesterol: 173 mg/dL (ref 0–200)
HDL: 56.2 mg/dL (ref >39.00)
LDL Cholesterol: 105 mg/dL — ABNORMAL HIGH (ref 0–99)
NonHDL: 116.54
Total CHOL/HDL Ratio: 3
Triglycerides: 59 mg/dL (ref 0.0–149.0)
VLDL: 11.8 mg/dL (ref 0.0–40.0)

## 2022-11-20 LAB — POC COVID19 BINAXNOW: SARS Coronavirus 2 Ag: NEGATIVE

## 2022-11-20 MED ORDER — VALSARTAN 40 MG PO TABS: 40.0000 mg | ORAL_TABLET | Freq: Every day | ORAL | 3 refills | Status: DC

## 2022-11-20 NOTE — Assessment & Plan Note (Signed)
Chronic GERD controlled Continue omeprazole 40 mg daily - tries to go every other day Encourage weight loss, GERD diet

## 2022-11-20 NOTE — Addendum Note (Signed)
Addended by: Karma Ganja on: 11/20/2022 09:13 AM   Modules accepted: Orders

## 2022-11-20 NOTE — Assessment & Plan Note (Signed)
Chronic Check a1c Low sugar / carb diet Stressed regular exercise  

## 2022-11-20 NOTE — Assessment & Plan Note (Signed)
Acute Symptoms likely viral in nature Covid rapid test is negative Continue symptomatic treatment with over-the-counter cold medications, Tylenol/ibuprofen Increase rest and fluids Call if symptoms worsen or do not improve Note given for work

## 2022-11-20 NOTE — Assessment & Plan Note (Addendum)
Chronic With hypertension, GERD Stressed regular exercise Discussed decreased portions, diet high in protein, fiber and vegetables and low in sugars and carbohydrates

## 2022-11-20 NOTE — Assessment & Plan Note (Addendum)
Chronic Blood pressure not ideally controlled CMP Continue hydrochlorothiazide 25 mg daily Start valsartan 40 mg daily Monitor Bp at home

## 2022-11-20 NOTE — Assessment & Plan Note (Signed)
Chronic Well-controlled No seizure-like activity in years Continue Keppra 500 mg twice daily No longer following with neurology due to stability on medication

## 2022-11-20 NOTE — Assessment & Plan Note (Signed)
GFR decreased with last blood work and has been slowly decreasing Start valsartan 40 mg daily Stressed weight loss, low sodium diet, lots of water intake Encouraged trying to get off omeprazole Cmp today

## 2022-11-24 ENCOUNTER — Other Ambulatory Visit: Payer: Self-pay | Admitting: Internal Medicine

## 2023-01-30 ENCOUNTER — Other Ambulatory Visit: Payer: Self-pay | Admitting: Internal Medicine

## 2023-05-09 ENCOUNTER — Other Ambulatory Visit: Payer: Self-pay | Admitting: Internal Medicine

## 2023-05-14 ENCOUNTER — Telehealth: Payer: Self-pay | Admitting: Internal Medicine

## 2023-05-14 NOTE — Telephone Encounter (Signed)
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

## 2023-05-15 MED ORDER — OSELTAMIVIR PHOSPHATE 75 MG PO CAPS: 75.0000 mg | ORAL_CAPSULE | Freq: Every day | ORAL | 0 refills | Status: AC

## 2023-05-15 NOTE — Telephone Encounter (Signed)
 Rx sent

## 2023-05-21 ENCOUNTER — Encounter: Payer: No Typology Code available for payment source | Admitting: Internal Medicine

## 2023-06-16 NOTE — Patient Instructions (Addendum)
 Blood work was ordered.       Medications changes include :   None    A referral was ordered and someone will call you to schedule an appointment.     Return in about 6 months (around 12/18/2023) for follow up.    Health Maintenance, Male Adopting a healthy lifestyle and getting preventive care are important in promoting health and wellness. Ask your health care provider about: The right schedule for you to have regular tests and exams. Things you can do on your own to prevent diseases and keep yourself healthy. What should I know about diet, weight, and exercise? Eat a healthy diet  Eat a diet that includes plenty of vegetables, fruits, low-fat dairy products, and lean protein. Do not eat a lot of foods that are high in solid fats, added sugars, or sodium. Maintain a healthy weight Body mass index (BMI) is a measurement that can be used to identify possible weight problems. It estimates body fat based on height and weight. Your health care provider can help determine your BMI and help you achieve or maintain a healthy weight. Get regular exercise Get regular exercise. This is one of the most important things you can do for your health. Most adults should: Exercise for at least 150 minutes each week. The exercise should increase your heart rate and make you sweat (moderate-intensity exercise). Do strengthening exercises at least twice a week. This is in addition to the moderate-intensity exercise. Spend less time sitting. Even light physical activity can be beneficial. Watch cholesterol and blood lipids Have your blood tested for lipids and cholesterol at 47 years of age, then have this test every 5 years. You may need to have your cholesterol levels checked more often if: Your lipid or cholesterol levels are high. You are older than 47 years of age. You are at high risk for heart disease. What should I know about cancer screening? Many types of cancers can be detected  early and may often be prevented. Depending on your health history and family history, you may need to have cancer screening at various ages. This may include screening for: Colorectal cancer. Prostate cancer. Skin cancer. Lung cancer. What should I know about heart disease, diabetes, and high blood pressure? Blood pressure and heart disease High blood pressure causes heart disease and increases the risk of stroke. This is more likely to develop in people who have high blood pressure readings or are overweight. Talk with your health care provider about your target blood pressure readings. Have your blood pressure checked: Every 3-5 years if you are 24-1 years of age. Every year if you are 77 years old or older. If you are between the ages of 56 and 78 and are a current or former smoker, ask your health care provider if you should have a one-time screening for abdominal aortic aneurysm (AAA). Diabetes Have regular diabetes screenings. This checks your fasting blood sugar level. Have the screening done: Once every three years after age 42 if you are at a normal weight and have a low risk for diabetes. More often and at a younger age if you are overweight or have a high risk for diabetes. What should I know about preventing infection? Hepatitis B If you have a higher risk for hepatitis B, you should be screened for this virus. Talk with your health care provider to find out if you are at risk for hepatitis B infection. Hepatitis C Blood testing is recommended for: Everyone  born from [birth year] through 1964. Anyone with known risk factors for hepatitis C. Sexually transmitted infections (STIs) You should be screened each year for STIs, including gonorrhea and chlamydia, if: You are sexually active and are younger than 47 years of age. You are older than 47 years of age and your health care provider tells you that you are at risk for this type of infection. Your sexual activity has changed since  you were last screened, and you are at increased risk for chlamydia or gonorrhea. Ask your health care provider if you are at risk. Ask your health care provider about whether you are at high risk for HIV. Your health care provider may recommend a prescription medicine to help prevent HIV infection. If you choose to take medicine to prevent HIV, you should first get tested for HIV. You should then be tested every 3 months for as long as you are taking the medicine. Follow these instructions at home: Alcohol use Do not drink alcohol if your health care provider tells you not to drink. If you drink alcohol: Limit how much you have to 0-2 drinks a day. Know how much alcohol is in your drink. In the [place] one drink equals one 12 oz bottle of beer (355 mL), one 5 oz glass of wine (148 mL), or one 1 oz glass of hard liquor (44 mL). Lifestyle Do not use any products that contain nicotine or tobacco. These products include cigarettes, chewing tobacco, and vaping devices, such as e-cigarettes. If you need help quitting, ask your health care provider. Do not use street drugs. Do not share needles. Ask your health care provider for help if you need support or information about quitting drugs. General instructions Schedule regular health, dental, and eye exams. Stay current with your vaccines. Tell your health care provider if: You often feel depressed. You have ever been abused or do not feel safe at home. Summary Adopting a healthy lifestyle and getting preventive care are important in promoting health and wellness. Follow your health care provider's instructions about healthy diet, exercising, and getting tested or screened for diseases. Follow your health care provider's instructions on monitoring your cholesterol and blood pressure. This information is not intended to replace advice given to you by your health care provider. Make sure you discuss any questions you have with your health care  provider. Document Revised: 08/14/2020 Document Reviewed: 08/14/2020 Elsevier Patient Education  2024 [employer].

## 2023-06-16 NOTE — Progress Notes (Unsigned)
 Subjective:    Patient ID: James Pitts, male    DOB: 1975/07/21, 48 y.o.   MRN: [id number]     HPI James Pitts is here for a physical exam and his chronic medical problems.   No changes in health.  No concerns.     Medications and allergies reviewed with patient and updated if appropriate.  Current Outpatient Medications on File Prior to Visit  Medication Sig Dispense Refill   hydrochlorothiazide (HYDRODIURIL) 25 MG tablet TAKE 1 TABLET (25 MG TOTAL) BY MOUTH DAILY. 90 tablet 1   levETIRAcetam (KEPPRA) 500 MG tablet TAKE 1 TABLET BY MOUTH TWICE A DAY 180 tablet 1   Melatonin 10 MG CAPS Take 1 cap nightly 30 capsule    omeprazole (PRILOSEC) 40 MG capsule TAKE 1 CAPSULE (40 MG TOTAL) BY MOUTH DAILY. 90 capsule 3   triamcinolone cream (KENALOG) 0.1 % Apply 1 Application topically 2 (two) times daily. 45 g 2   valsartan (DIOVAN) 40 MG tablet Take 1 tablet (40 mg total) by mouth daily. 90 tablet 3   No current facility-administered medications on file prior to visit.    Review of Systems  Constitutional:  Negative for fever.  Eyes:  Negative for visual disturbance.  Respiratory:  Negative for cough, shortness of breath and wheezing.   Cardiovascular:  Negative for chest pain, palpitations and leg swelling.  Gastrointestinal:  Negative for abdominal pain, blood in stool, constipation and diarrhea.       No gerd  Genitourinary:  Negative for difficulty urinating, dysuria and hematuria.  Musculoskeletal:  Negative for arthralgias and back pain.  Skin:  Positive for rash (eczema).  Neurological:  Negative for light-headedness and headaches.  Psychiatric/Behavioral:  Negative for dysphoric mood. The patient is not nervous/anxious.        Objective:   Vitals:   06/17/23 1445  BP: 124/70  Pulse: 67  Temp: 98.7 F (37.1 C)  SpO2: 97%   Filed Weights   06/17/23 1445  Weight: 283 lb (128.4 kg)   Body mass index is 35.37 kg/m.  BP Readings from Last 3 Encounters:   06/17/23 124/70  11/20/22 (!) 140/72  05/22/22 134/78    Wt Readings from Last 3 Encounters:  06/17/23 283 lb (128.4 kg)  11/20/22 288 lb (130.6 kg)  05/22/22 292 lb (132.5 kg)      Physical Exam Constitutional: He appears well-developed and well-nourished. No distress.  HENT:  Head: Normocephalic and atraumatic.  Right Ear: External ear normal.  Left Ear: External ear normal.  Normal ear canals and TM b/l  Mouth/Throat: Oropharynx is clear and moist. Eyes: Conjunctivae and EOM are normal.  Neck: Neck supple. No tracheal deviation present. No thyromegaly present.  No carotid bruit  Cardiovascular: Normal rate, regular rhythm, normal heart sounds and intact distal pulses.   No murmur heard.  No lower extremity edema. Pulmonary/Chest: Effort normal and breath sounds normal. No respiratory distress. He has no wheezes. He has no rales.  Abdominal: Soft. He exhibits no distension. There is no tenderness.  Genitourinary: deferred  Lymphadenopathy:   He has no cervical adenopathy.  Skin: Skin is warm and dry. He is not diaphoretic.  Psychiatric: He has a normal mood and affect. His behavior is normal.         Assessment & Plan:   Physical exam: Screening blood work  ordered Exercise   - intermittent Weight  obese - weight loss Substance abuse   none   Reviewed recommended immunizations.   Health  Maintenance  Topic Date Due   COVID-19 Vaccine (5 - 2024-25 season) 07/02/2023 (Originally 12/08/2022)   INFLUENZA VACCINE  07/07/2023 (Originally 11/07/2022)   DTaP/Tdap/Td (2 - Td or Tdap) 01/22/2026   Colonoscopy  01/08/2029   Hepatitis C Screening  Completed   HIV Screening  Completed   HPV VACCINES  Aged Out     See Problem List for Assessment and Plan of chronic medical problems.

## 2023-06-17 ENCOUNTER — Encounter: Payer: Self-pay | Admitting: Internal Medicine

## 2023-06-17 ENCOUNTER — Ambulatory Visit (INDEPENDENT_AMBULATORY_CARE_PROVIDER_SITE_OTHER): Payer: No Typology Code available for payment source | Admitting: Internal Medicine

## 2023-06-17 VITALS — BP 124/70 | HR 67 | Temp 98.7°F | Ht 75.0 in | Wt 283.0 lb

## 2023-06-17 DIAGNOSIS — I10 Essential (primary) hypertension: Secondary | ICD-10-CM

## 2023-06-17 DIAGNOSIS — Z125 Encounter for screening for malignant neoplasm of prostate: Secondary | ICD-10-CM

## 2023-06-17 DIAGNOSIS — Z0000 Encounter for general adult medical examination without abnormal findings: Secondary | ICD-10-CM

## 2023-06-17 DIAGNOSIS — R7303 Prediabetes: Secondary | ICD-10-CM | POA: Diagnosis not present

## 2023-06-17 DIAGNOSIS — Z6835 Body mass index (BMI) 35.0-35.9, adult: Secondary | ICD-10-CM

## 2023-06-17 DIAGNOSIS — K219 Gastro-esophageal reflux disease without esophagitis: Secondary | ICD-10-CM

## 2023-06-17 DIAGNOSIS — R569 Unspecified convulsions: Secondary | ICD-10-CM

## 2023-06-17 LAB — CBC WITH DIFFERENTIAL/PLATELET
Basophils Absolute: 0.1 10*3/uL (ref 0.0–0.1)
Basophils Relative: 1.2 % (ref 0.0–3.0)
Eosinophils Absolute: 0.4 10*3/uL (ref 0.0–0.7)
Eosinophils Relative: 6.8 % — ABNORMAL HIGH (ref 0.0–5.0)
HCT: 38.6 % — ABNORMAL LOW (ref 39.0–52.0)
Hemoglobin: 13 g/dL (ref 13.0–17.0)
Lymphocytes Relative: 32.3 % (ref 12.0–46.0)
Lymphs Abs: 2 10*3/uL (ref 0.7–4.0)
MCHC: 33.6 g/dL (ref 30.0–36.0)
MCV: 93.5 fl (ref 78.0–100.0)
Monocytes Absolute: 0.5 10*3/uL (ref 0.1–1.0)
Monocytes Relative: 8 % (ref 3.0–12.0)
Neutro Abs: 3.2 10*3/uL (ref 1.4–7.7)
Neutrophils Relative %: 51.7 % (ref 43.0–77.0)
Platelets: 432 10*3/uL — ABNORMAL HIGH (ref 150.0–400.0)
RBC: 4.13 Mil/uL — ABNORMAL LOW (ref 4.22–5.81)
RDW: 13 % (ref 11.5–15.5)
WBC: 6.1 10*3/uL (ref 4.0–10.5)

## 2023-06-17 LAB — LIPID PANEL
Cholesterol: 169 mg/dL (ref 0–200)
HDL: 54.2 mg/dL (ref >39.00)
LDL Cholesterol: 99 mg/dL (ref 0–99)
NonHDL: 115.24
Total CHOL/HDL Ratio: 3
Triglycerides: 80 mg/dL (ref 0.0–149.0)
VLDL: 16 mg/dL (ref 0.0–40.0)

## 2023-06-17 LAB — COMPREHENSIVE METABOLIC PANEL
ALT: 26 U/L (ref 0–53)
AST: 19 U/L (ref 0–37)
Albumin: 4.6 g/dL (ref 3.5–5.2)
Alkaline Phosphatase: 110 U/L (ref 39–117)
BUN: 22 mg/dL (ref 6–23)
CO2: 30 meq/L (ref 19–32)
Calcium: 9.9 mg/dL (ref 8.4–10.5)
Chloride: 98 meq/L (ref 96–112)
Creatinine, Ser: 1.42 mg/dL (ref 0.40–1.50)
GFR: 59.11 mL/min — ABNORMAL LOW (ref >60.00)
Glucose, Bld: 84 mg/dL (ref 70–99)
Potassium: 4.5 meq/L (ref 3.5–5.1)
Sodium: 136 meq/L (ref 135–145)
Total Bilirubin: 0.5 mg/dL (ref 0.2–1.2)
Total Protein: 8.5 g/dL — ABNORMAL HIGH (ref 6.0–8.3)

## 2023-06-17 LAB — HEMOGLOBIN A1C: Hgb A1c MFr Bld: 6 % (ref 4.6–6.5)

## 2023-06-17 LAB — PSA: PSA: 2.08 ng/mL (ref 0.10–4.00)

## 2023-06-17 LAB — TSH: TSH: 1.65 u[IU]/mL (ref 0.35–5.50)

## 2023-06-17 MED ORDER — TRIAMCINOLONE ACETONIDE 0.1 % EX CREA: 1.0000 | TOPICAL_CREAM | Freq: Two times a day (BID) | CUTANEOUS | 2 refills | Status: AC

## 2023-06-17 NOTE — Assessment & Plan Note (Signed)
Chronic Well-controlled No seizure-like activity in years Continue Keppra 500 mg twice daily No longer following with neurology due to stability on medication

## 2023-06-17 NOTE — Assessment & Plan Note (Addendum)
 Chronic With hypertension, GERD Discussed importance of weight loss to prevent lower extremity osteoarthritis, back pain, decrease risk of heart disease, strokes, improve sugars, improved blood pressure and decreased risk of liver cirrhosis Stressed regular exercise Discussed decreased portions, diet high in protein, fiber and vegetables and low in sugars and carbohydrates

## 2023-06-17 NOTE — Assessment & Plan Note (Signed)
 Chronic Blood pressure not ideally controlled CMP, CBC Continue hydrochlorothiazide 25 mg daily,  valsartan 40 mg daily Monitor Bp at home

## 2023-06-17 NOTE — Assessment & Plan Note (Signed)
Chronic GERD controlled Continue omeprazole 40 mg daily - tries to go every other day Encourage weight loss, GERD diet

## 2023-06-17 NOTE — Assessment & Plan Note (Signed)
 Chronic Lab Results  Component Value Date   HGBA1C 5.8 11/20/2022   Check a1c Low sugar / carb diet Stressed regular exercise

## 2023-06-18 ENCOUNTER — Encounter: Payer: Self-pay | Admitting: Internal Medicine

## 2023-08-08 ENCOUNTER — Other Ambulatory Visit: Payer: Self-pay | Admitting: Internal Medicine

## 2023-11-12 ENCOUNTER — Other Ambulatory Visit: Payer: Self-pay | Admitting: Internal Medicine

## 2023-12-16 ENCOUNTER — Other Ambulatory Visit: Payer: Self-pay | Admitting: Internal Medicine

## 2023-12-17 DIAGNOSIS — N183 Chronic kidney disease, stage 3 unspecified: Secondary | ICD-10-CM | POA: Insufficient documentation

## 2023-12-17 NOTE — Patient Instructions (Addendum)
    Flu immunization administered today.     Blood work was ordered.       Medications changes include :   None    An ultrasound of your kidneys was ordered and someone will call you to schedule an appointment.     Return in about 6 months (around 06/16/2024) for Physical Exam.

## 2023-12-17 NOTE — Progress Notes (Unsigned)
 Subjective:    Patient ID: James Pitts, male    DOB: [birth year], 48 y.o.   MRN: [id number]     HPI James Pitts is here for follow up of his chronic medical problems.  Ckd new   Medications and allergies reviewed with patient and updated if appropriate.  Current Outpatient Medications on File Prior to Visit  Medication Sig Dispense Refill   hydrochlorothiazide  (HYDRODIURIL ) 25 MG tablet TAKE 1 TABLET (25 MG TOTAL) BY MOUTH DAILY. 90 tablet 1   levETIRAcetam  (KEPPRA ) 500 MG tablet TAKE 1 TABLET BY MOUTH TWICE A DAY 180 tablet 1   Melatonin 10 MG CAPS Take 1 cap nightly 30 capsule    omeprazole  (PRILOSEC) 40 MG capsule TAKE 1 CAPSULE (40 MG TOTAL) BY MOUTH DAILY. 90 capsule 3   triamcinolone  cream (KENALOG ) 0.1 % Apply 1 Application topically 2 (two) times daily. 45 g 2   valsartan  (DIOVAN ) 40 MG tablet TAKE 1 TABLET BY MOUTH EVERY DAY 90 tablet 3   No current facility-administered medications on file prior to visit.     Review of Systems  Constitutional:  Negative for fever.  Respiratory:  Negative for cough, shortness of breath and wheezing.   Cardiovascular:  Negative for chest pain, palpitations and leg swelling.  Neurological:  Negative for light-headedness and headaches.       Objective:  There were no vitals filed for this visit. BP Readings from Last 3 Encounters:  06/17/23 124/70  11/20/22 (!) 140/72  05/22/22 134/78   Wt Readings from Last 3 Encounters:  06/17/23 283 lb (128.4 kg)  11/20/22 288 lb (130.6 kg)  05/22/22 292 lb (132.5 kg)   There is no height or weight on file to calculate BMI.    Physical Exam Constitutional:      General: He is not in acute distress.    Appearance: Normal appearance. He is not ill-appearing.  HENT:     Head: Normocephalic and atraumatic.  Eyes:     Conjunctiva/sclera: Conjunctivae normal.  Cardiovascular:     Rate and Rhythm: Normal rate and regular rhythm.     Heart sounds: Normal heart sounds.  Pulmonary:      Effort: Pulmonary effort is normal. No respiratory distress.     Breath sounds: Normal breath sounds. No wheezing or rales.  Musculoskeletal:     Right lower leg: No edema.     Left lower leg: No edema.  Skin:    General: Skin is warm and dry.     Findings: No rash.  Neurological:     Mental Status: He is alert. Mental status is at baseline.  Psychiatric:        Mood and Affect: Mood normal.        Lab Results  Component Value Date   WBC 6.1 06/17/2023   HGB 13.0 06/17/2023   HCT 38.6 (L) 06/17/2023   PLT 432.0 (H) 06/17/2023   GLUCOSE 84 06/17/2023   CHOL 169 06/17/2023   TRIG 80.0 06/17/2023   HDL 54.20 06/17/2023   LDLCALC 99 06/17/2023   ALT 26 06/17/2023   AST 19 06/17/2023   NA 136 06/17/2023   K 4.5 06/17/2023   CL 98 06/17/2023   CREATININE 1.42 06/17/2023   BUN 22 06/17/2023   CO2 30 06/17/2023   TSH 1.65 06/17/2023   PSA 2.08 06/17/2023   HGBA1C 6.0 06/17/2023     Assessment & Plan:    See Problem List for Assessment and Plan of chronic medical  problems.

## 2023-12-18 ENCOUNTER — Ambulatory Visit: Payer: Self-pay | Admitting: Internal Medicine

## 2023-12-18 ENCOUNTER — Ambulatory Visit: Admitting: Internal Medicine

## 2023-12-18 VITALS — BP 126/82 | HR 67 | Temp 98.2°F | Ht 75.0 in | Wt 280.0 lb

## 2023-12-18 DIAGNOSIS — N1831 Chronic kidney disease, stage 3a: Secondary | ICD-10-CM | POA: Diagnosis not present

## 2023-12-18 DIAGNOSIS — R7303 Prediabetes: Secondary | ICD-10-CM

## 2023-12-18 DIAGNOSIS — I10 Essential (primary) hypertension: Secondary | ICD-10-CM

## 2023-12-18 DIAGNOSIS — Z23 Encounter for immunization: Secondary | ICD-10-CM

## 2023-12-18 DIAGNOSIS — R569 Unspecified convulsions: Secondary | ICD-10-CM

## 2023-12-18 DIAGNOSIS — K219 Gastro-esophageal reflux disease without esophagitis: Secondary | ICD-10-CM | POA: Diagnosis not present

## 2023-12-18 LAB — CBC WITH DIFFERENTIAL/PLATELET
Basophils Absolute: 0.1 K/uL (ref 0.0–0.1)
Basophils Relative: 2.8 % (ref 0.0–3.0)
Eosinophils Absolute: 0.3 K/uL (ref 0.0–0.7)
Eosinophils Relative: 5.1 % — ABNORMAL HIGH (ref 0.0–5.0)
HCT: 37.6 % — ABNORMAL LOW (ref 39.0–52.0)
Hemoglobin: 12.8 g/dL — ABNORMAL LOW (ref 13.0–17.0)
Lymphocytes Relative: 27.1 % (ref 12.0–46.0)
Lymphs Abs: 1.4 K/uL (ref 0.7–4.0)
MCHC: 33.9 g/dL (ref 30.0–36.0)
MCV: 91.4 fl (ref 78.0–100.0)
Monocytes Absolute: 0.6 K/uL (ref 0.1–1.0)
Monocytes Relative: 10.9 % (ref 3.0–12.0)
Neutro Abs: 2.8 K/uL (ref 1.4–7.7)
Neutrophils Relative %: 54.1 % (ref 43.0–77.0)
Platelets: 390 K/uL (ref 150.0–400.0)
RBC: 4.11 Mil/uL — ABNORMAL LOW (ref 4.22–5.81)
RDW: 12.9 % (ref 11.5–15.5)
WBC: 5.3 K/uL (ref 4.0–10.5)

## 2023-12-18 LAB — LIPID PANEL
Cholesterol: 173 mg/dL (ref 0–200)
HDL: 52 mg/dL (ref >39.00)
LDL Cholesterol: 111 mg/dL — ABNORMAL HIGH (ref 0–99)
NonHDL: 121.1
Total CHOL/HDL Ratio: 3
Triglycerides: 50 mg/dL (ref 0.0–149.0)
VLDL: 10 mg/dL (ref 0.0–40.0)

## 2023-12-18 LAB — URINALYSIS, ROUTINE W REFLEX MICROSCOPIC
Bilirubin Urine: NEGATIVE
Hgb urine dipstick: NEGATIVE
Ketones, ur: NEGATIVE
Leukocytes,Ua: NEGATIVE
Nitrite: NEGATIVE
RBC / HPF: NONE SEEN (ref 0–?)
Specific Gravity, Urine: 1.015 (ref 1.000–1.030)
Total Protein, Urine: NEGATIVE
Urine Glucose: NEGATIVE
Urobilinogen, UA: 0.2 (ref 0.0–1.0)
pH: 7 (ref 5.0–8.0)

## 2023-12-18 LAB — COMPREHENSIVE METABOLIC PANEL WITH GFR
ALT: 32 U/L (ref 0–53)
AST: 25 U/L (ref 0–37)
Albumin: 4.3 g/dL (ref 3.5–5.2)
Alkaline Phosphatase: 85 U/L (ref 39–117)
BUN: 24 mg/dL — ABNORMAL HIGH (ref 6–23)
CO2: 28 meq/L (ref 19–32)
Calcium: 9.5 mg/dL (ref 8.4–10.5)
Chloride: 100 meq/L (ref 96–112)
Creatinine, Ser: 1.41 mg/dL (ref 0.40–1.50)
GFR: 59.4 mL/min — ABNORMAL LOW (ref >60.00)
Glucose, Bld: 95 mg/dL (ref 70–99)
Potassium: 4.5 meq/L (ref 3.5–5.1)
Sodium: 136 meq/L (ref 135–145)
Total Bilirubin: 0.3 mg/dL (ref 0.2–1.2)
Total Protein: 8.5 g/dL — ABNORMAL HIGH (ref 6.0–8.3)

## 2023-12-18 LAB — MICROALBUMIN / CREATININE URINE RATIO
Creatinine,U: 130.8 mg/dL
Microalb Creat Ratio: 7.4 mg/g (ref 0.0–30.0)
Microalb, Ur: 1 mg/dL (ref 0.0–1.9)

## 2023-12-18 LAB — HEMOGLOBIN A1C: Hgb A1c MFr Bld: 6.2 % (ref 4.6–6.5)

## 2023-12-18 NOTE — Assessment & Plan Note (Signed)
Chronic Well-controlled No seizure-like activity in years Continue Keppra 500 mg twice daily No longer following with neurology due to stability on medication

## 2023-12-18 NOTE — Assessment & Plan Note (Addendum)
 Chronic GFR consistently decreased Stage IIIa Discussed contributing factors weight, hypertension, prediabetes Continue valsartan  Stressed healthy diet, weight loss, good BP and sugar control Avoid NSAIDs Stays hydrated CBC, CMP Renal US 

## 2023-12-18 NOTE — Assessment & Plan Note (Signed)
 Chronic With hypertension, GERD, prediabetes Discussed importance of weight loss  Stressed regular exercise Work on [employer]

## 2023-12-18 NOTE — Assessment & Plan Note (Signed)
 Chronic GERD controlled Continue omeprazole  40 mg daily - tries to take it every other day Encourage weight loss, GERD diet

## 2023-12-18 NOTE — Assessment & Plan Note (Signed)
 Chronic Lab Results  Component Value Date   HGBA1C 6.0 06/17/2023   Check a1c Low sugar / carb diet Stressed regular exercise Encouraged weight loss

## 2023-12-18 NOTE — Assessment & Plan Note (Signed)
 Chronic Blood pressure controlled CMP, CBC Continue hydrochlorothiazide  25 mg daily,  valsartan  40 mg daily Monitor blood pressure at home

## 2024-01-31 ENCOUNTER — Ambulatory Visit (HOSPITAL_BASED_OUTPATIENT_CLINIC_OR_DEPARTMENT_OTHER)
Admission: RE | Admit: 2024-01-31 | Discharge: 2024-01-31 | Disposition: A | Discharge status: Home / Self Care | Source: Ambulatory Visit | Attending: Internal Medicine | Admitting: Internal Medicine

## 2024-01-31 DIAGNOSIS — N1831 Chronic kidney disease, stage 3a: Secondary | ICD-10-CM | POA: Diagnosis present

## 2024-02-14 ENCOUNTER — Other Ambulatory Visit: Payer: Self-pay | Admitting: Internal Medicine

## 2024-06-18 ENCOUNTER — Encounter: Admitting: Internal Medicine
